# Patient Record
Sex: Female | Born: 2000 | Race: White | Hispanic: No | Marital: Single
Health system: Southern US, Community
[De-identification: ages and names within clinical notes are randomized; demographics above are authoritative.]

## PROBLEM LIST (undated history)

## (undated) DIAGNOSIS — R0600 Dyspnea, unspecified: Secondary | ICD-10-CM

## (undated) DIAGNOSIS — G90A Postural orthostatic tachycardia syndrome (POTS): Secondary | ICD-10-CM

## (undated) DIAGNOSIS — R079 Chest pain, unspecified: Secondary | ICD-10-CM

## (undated) DIAGNOSIS — R5383 Other fatigue: Secondary | ICD-10-CM

## (undated) DIAGNOSIS — J069 Acute upper respiratory infection, unspecified: Secondary | ICD-10-CM

## (undated) DIAGNOSIS — G4700 Insomnia, unspecified: Secondary | ICD-10-CM

## (undated) DIAGNOSIS — U071 COVID-19: Secondary | ICD-10-CM

## (undated) DIAGNOSIS — S060XAA Concussion with loss of consciousness status unknown, initial encounter: Secondary | ICD-10-CM

## (undated) DIAGNOSIS — G4710 Hypersomnia, unspecified: Secondary | ICD-10-CM

## (undated) HISTORY — DX: Postural orthostatic tachycardia syndrome (POTS): G90.A

## (undated) HISTORY — DX: Dyspnea, unspecified: R06.00

## (undated) HISTORY — DX: Insomnia, unspecified: G47.00

## (undated) HISTORY — PX: TYMPANOSTOMY TUBE PLACEMENT: SHX32

## (undated) HISTORY — DX: Acute upper respiratory infection, unspecified: J06.9

## (undated) HISTORY — PX: CHOLECYSTECTOMY: SHX55

## (undated) HISTORY — DX: Chest pain, unspecified: R07.9

## (undated) HISTORY — DX: COVID-19: U07.1

## (undated) HISTORY — DX: Hypersomnia, unspecified: G47.10

## (undated) HISTORY — DX: Other fatigue: R53.83

---
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
## 2022-04-21 ENCOUNTER — Emergency Department (HOSPITAL_COMMUNITY): Payer: Medicaid Other

## 2022-04-21 ENCOUNTER — Encounter (HOSPITAL_COMMUNITY): Payer: Self-pay

## 2022-04-21 ENCOUNTER — Emergency Department (HOSPITAL_COMMUNITY)
Admission: EM | Admit: 2022-04-21 | Discharge: 2022-04-21 | Disposition: A | Discharge status: Home / Self Care | Payer: Medicaid Other | Attending: Emergency Medicine | Admitting: Emergency Medicine

## 2022-04-21 ENCOUNTER — Other Ambulatory Visit: Payer: Self-pay

## 2022-04-21 DIAGNOSIS — R0602 Shortness of breath: Secondary | ICD-10-CM | POA: Insufficient documentation

## 2022-04-21 DIAGNOSIS — R0789 Other chest pain: Secondary | ICD-10-CM | POA: Diagnosis not present

## 2022-04-21 LAB — BASIC METABOLIC PANEL
Anion gap: 7 (ref 5–15)
BUN: 12 mg/dL (ref 6–20)
CO2: 25 mmol/L (ref 22–32)
Calcium: 9.1 mg/dL (ref 8.9–10.3)
Chloride: 109 mmol/L (ref 98–111)
Creatinine, Ser: 0.56 mg/dL (ref 0.44–1.00)
GFR, Estimated: >60 mL/min (ref >60)
Glucose, Bld: 92 mg/dL (ref 70–99)
Potassium: 4.3 mmol/L (ref 3.5–5.1)
Sodium: 141 mmol/L (ref 135–145)

## 2022-04-21 LAB — CBC
HCT: 40.6 % (ref 36.0–46.0)
Hemoglobin: 13.1 g/dL (ref 12.0–15.0)
MCH: 27.1 pg (ref 26.0–34.0)
MCHC: 32.3 g/dL (ref 30.0–36.0)
MCV: 83.9 fL (ref 80.0–100.0)
Platelets: 314 10*3/uL (ref 150–400)
RBC: 4.84 MIL/uL (ref 3.87–5.11)
RDW: 12.9 % (ref 11.5–15.5)
WBC: 7.1 10*3/uL (ref 4.0–10.5)
nRBC: 0 % (ref 0.0–0.2)

## 2022-04-21 LAB — TROPONIN I (HIGH SENSITIVITY)
Troponin I (High Sensitivity): <2 ng/L (ref <18)
Troponin I (High Sensitivity): <2 ng/L (ref <18)

## 2022-04-21 MED ORDER — ACETAMINOPHEN 325 MG PO TABS
650.0000 mg | ORAL_TABLET | Freq: Once | ORAL | Status: AC
  Administered 2022-04-21: 650 mg via ORAL
  Filled 2022-04-21: qty 2

## 2022-04-21 MED ORDER — KETOROLAC TROMETHAMINE 30 MG/ML IJ SOLN
30.0000 mg | Freq: Once | INTRAMUSCULAR | Status: AC
  Administered 2022-04-21: 30 mg via INTRAMUSCULAR
  Filled 2022-04-21: qty 1

## 2022-04-21 NOTE — ED Triage Notes (Signed)
Pt reports chest tightness, dizziness and nausea. This has been intermittent for about a year. She came into day because it feels worse.

## 2022-04-21 NOTE — Discharge Instructions (Signed)
You have been seen today for your complaint of chest pain. Your lab work was reassuring and showed no abnormalities. Your imaging was reassuring and showed no abnormalities. Your discharge medications include Alternate tylenol and ibuprofen for pain. You may alternate these every 4 hours. You may take up to 800 mg of ibuprofen at a time and up to 1000 mg of tylenol. Follow up with: Cardiology.  They should call you within 3 business days to schedule an appointment Please seek immediate medical care if you develop any of the following symptoms: You have nausea or vomiting. You feel sweaty or light-headed. You have a cough with mucus from your lungs (sputum) or you cough up blood. You develop shortness of breath. At this time there does not appear to be the presence of an emergent medical condition, however there is always the potential for conditions to change. Please read and follow the below instructions.  Do not take your medicine if  develop an itchy rash, swelling in your mouth or lips, or difficulty breathing; call 911 and seek immediate emergency medical attention if this occurs.  You may review your lab tests and imaging results in their entirety on your MyChart account.  Please discuss all results of fully with your primary care provider and other specialist at your follow-up visit.  Note: Portions of this text may have been transcribed using voice recognition software. Every effort was made to ensure accuracy; however, inadvertent computerized transcription errors may still be present.

## 2022-04-21 NOTE — ED Provider Triage Note (Signed)
Emergency Medicine Provider Triage Evaluation Note  Rebecca Fuller , a 22 y.o. female  was evaluated in triage.  Pt complains of right-sided chest pain.  Has had this intermittently over the past year.  It got worse this morning at approximately 9 AM and has persisted.  It is not exertional.  No cardiac history.  Has seen cardiology for this in the past.  Reports mild associated shortness of breath.  Denies cough, fevers, vomiting.  Review of Systems  Positive: As above Negative: As above  Physical Exam  BP 116/64 (BP Location: Left Arm)   Pulse 90   Temp 98.5 F (36.9 C) (Oral)   Resp 17   Ht 4' 9.5" (1.461 m)   Wt 40.8 kg   SpO2 100%   BMI 19.14 kg/m  Gen:   Awake, no distress   Resp:  Normal effort  MSK:   Moves extremities without difficulty  Other:    Medical Decision Making  Medically screening exam initiated at 7:08 PM.  Appropriate orders placed.  [address] [name] was informed that the remainder of the evaluation will be completed by another provider, this initial triage assessment does not replace that evaluation, and the importance of remaining in the ED until their evaluation is complete.  ACS rule out   Rebecca Fuller 04/21/22 1909

## 2022-04-21 NOTE — ED Provider Notes (Signed)
[place] EMERGENCY DEPARTMENT AT [facility] Provider Note   CSN: [id number] Arrival date & time: 04/21/22  1846     History  Chief Complaint  Patient presents with   Chest Pain    Rebecca Fuller is a 22 y.o. female.  With a history of anxiety who presents to the ED for evaluation of right-sided chest pain.  She has had these pains intermittently for the past year.  She saw cardiology at [facility] and was unable to find the cause of her pain.  She presents today because she woke up and the pain was worse than normal beginning at approximately 9 AM and has persisted.  It is localized to the right side of her chest without radiation.  It is not exertional.  Denies other cardiac history or family history of sudden cardiac death.  She reports mild intermittent shortness of breath that she notices mostly when she is performing strenuous activity.  She denies cough, fevers, vomiting, diaphoresis, abdominal pain, history of DVT or PE, unilateral leg swelling, recent travel, surgeries or cancer treatments.  She does have a Nexplanon implant but denies other hormonal birth control.   Chest Pain      Home Medications Prior to Admission medications   Medication Sig Start Date End Date Taking? Authorizing Provider  amitriptyline (ELAVIL) 25 MG tablet Take 1 tablet every night 03/06/19   Teressa Lower, MD  b complex vitamins tablet Take 1 tablet by mouth daily. Patient not taking: Reported on 11/16/2018 09/22/18   Teressa Lower, MD  cyclobenzaprine (FLEXERIL) 10 MG tablet Take 1 tablet (10 mg total) by mouth 2 (two) times daily as needed for muscle spasms. 05/18/21   Tacy Learn, PA-C  diclofenac (VOLTAREN) 50 MG EC tablet Take 1 tablet (50 mg total) by mouth 2 (two) times daily. 05/18/21   Tacy Learn, PA-C  ferrous sulfate 325 (65 FE) MG tablet Take by mouth. 08/24/17   [provider]  FLUoxetine (PROZAC) 20 MG capsule TK 1 C PO QD 12/13/17   [provider]   ketorolac (TORADOL) 10 MG tablet Take 10 mg by mouth every 6 (six) hours as needed.    [provider]  Magnesium Oxide 500 MG TABS Take 1 tablet (500 mg total) by mouth daily. 09/22/18   Teressa Lower, MD  norelgestromin-ethinyl estradiol Marilu Favre) 150-35 MCG/24HR transdermal patch Place onto the skin. 06/16/17   [provider]  ondansetron (ZOFRAN) 4 MG tablet Take 1 tablet (4 mg total) by mouth every 8 (eight) hours as needed for nausea or vomiting. 10/11/18   Teressa Lower, MD      Allergies    Patient has no known allergies.    Review of Systems   Review of Systems  Cardiovascular:  Positive for chest pain.  All other systems reviewed and are negative.   Physical Exam Updated Vital Signs BP 114/62 (BP Location: Left Arm)   Pulse 87   Temp 98.6 F (37 C) (Oral)   Resp 16   Ht 4' 9.5" (1.461 m)   Wt 40.8 kg   SpO2 100%   BMI 19.14 kg/m  Physical Exam Vitals and nursing note reviewed.  Constitutional:      General: She is not in acute distress.    Appearance: She is well-developed.  HENT:     Head: Normocephalic and atraumatic.  Eyes:     Conjunctiva/sclera: Conjunctivae normal.  Cardiovascular:     Rate and Rhythm: Normal rate and regular rhythm.  Heart sounds: No murmur heard. Pulmonary:     Effort: Pulmonary effort is normal. No respiratory distress.     Breath sounds: Normal breath sounds.  Abdominal:     Palpations: Abdomen is soft.     Tenderness: There is no abdominal tenderness.  Musculoskeletal:        General: No swelling.     Cervical back: Neck supple.     Right lower leg: No edema.     Left lower leg: No edema.  Skin:    General: Skin is warm and dry.     Capillary Refill: Capillary refill takes less than 2 seconds.  Neurological:     Mental Status: She is alert.  Psychiatric:        Mood and Affect: Mood normal.     ED Results / Procedures / Treatments   Labs (all labs ordered are listed, but only abnormal results  are displayed) Labs Reviewed  BASIC METABOLIC PANEL  CBC  TROPONIN I (HIGH SENSITIVITY)  TROPONIN I (HIGH SENSITIVITY)    EKG EKG Interpretation  Date/Time:  Wednesday April 21 2022 18:56:25 EDT Ventricular Rate:  97 PR Interval:  117 QRS Duration: 84 QT Interval:  346 QTC Calculation: 440 R Axis:   66 Text Interpretation: Sinus rhythm Borderline short PR interval RSR' in V1 or V2, right VCD or RVH No old tracing to compare Confirmed by Aletta Edouard [phone]) on 04/21/2022 10:20:18 PM  Radiology DG Chest 2 View  Result Date: 04/21/2022 CLINICAL DATA:  Chest pain and shortness of breath. EXAM: CHEST - 2 VIEW COMPARISON:  September 24, 2020 FINDINGS: The heart size and mediastinal contours are within normal limits. Both lungs are clear. Radiopaque surgical clips are seen within the right upper quadrant. The visualized skeletal structures are unremarkable. IMPRESSION: No active cardiopulmonary disease. Electronically Signed   By: Virgina Norfolk M.D.   On: 04/21/2022 19:29    Procedures Procedures    Medications Ordered in ED Medications  ketorolac (TORADOL) 30 MG/ML injection 30 mg (30 mg Intramuscular Given 04/21/22 2040)  acetaminophen (TYLENOL) tablet 650 mg (650 mg Oral Given 04/21/22 2303)    ED Course/ Medical Decision Making/ A&P Clinical Course as of 04/21/22 2345  Wed Apr 21, 2022  2124 Patient reports improvement in her symptoms after Toradol [AS]    Clinical Course User Index [AS] Claudie Fisherman Grafton Folk, PA-C                             Medical Decision Making Amount and/or Complexity of Data Reviewed Labs: ordered. Radiology: ordered.  Risk OTC drugs. Prescription drug management.  This patient presents to the ED for concern of chest pain, this involves an extensive number of treatment options, and is a complaint that carries with it a high risk of complications and morbidity.  The emergent differential diagnosis of chest pain includes: Acute coronary  syndrome, pericarditis, aortic dissection, pulmonary embolism, tension pneumothorax, and esophageal rupture.  I do not believe the patient has an emergent cause of chest pain, other urgent/non-acute considerations include, but are not limited to: chronic angina, aortic stenosis, cardiomyopathy, myocarditis, mitral valve prolapse, pulmonary hypertension, hypertrophic obstructive cardiomyopathy (HOCM), aortic insufficiency, right ventricular hypertrophy, pneumonia, pleuritis, bronchitis, pneumothorax, tumor, gastroesophageal reflux disease (GERD), esophageal spasm, Mallory-Weiss syndrome, peptic ulcer disease, biliary disease, pancreatitis, functional gastrointestinal pain, cervical or thoracic disk disease or arthritis, shoulder arthritis, costochondritis, subacromial bursitis, anxiety or panic attack, herpes zoster, breast disorders, chest wall  tumors, thoracic outlet syndrome, mediastinitis.   Co morbidities that complicate the patient evaluation   anxiety, chronic chest pain  My initial workup includes ACS rule out, pain control  Additional history obtained from: Nursing notes from this visit.  I ordered, reviewed and interpreted labs which include: CBC, BMP, troponin, delta troponin.  Labs within normal limits  I ordered imaging studies including x-ray I independently visualized and interpreted imaging which showed normal I agree with the radiologist interpretation  Afebrile, hemodynamically stable.  22 year old female presents ED for evaluation of right-sided chest pain.  This chest pain has been intermittent for the past year and has not a new sensation.  It is slightly worse than it normally is this morning.  Initial and delta troponin negative.  Chest x-ray with no abnormalities.  EKG without ischemic changes.  Low suspicion for ACS as the cause of her symptoms.  PE was on the differential was considered to be less likely due to lack of risk factors and hemodynamic stability.  Her exam is  reassuring.  She is anxious, but there are no other abnormalities.  Ambulatory referral to cardiology was placed for second opinion as she was now pleased with the care she received at [facility] cardiology.  Symptoms appear to be mostly related to costochondritis versus anxiety at this time.  She did report significant improvement in her pain after Toradol.  She was given return precautions.  Stable at discharge.  At this time there does not appear to be any evidence of an acute emergency medical condition and the patient appears stable for discharge with appropriate outpatient follow up. Diagnosis was discussed with patient who verbalizes understanding of care plan and is agreeable to discharge. I have discussed return precautions with patient who verbalizes understanding. Patient encouraged to follow-up with their PCP within 1 week. All questions answered.  Note: Portions of this report may have been transcribed using voice recognition software. Every effort was made to ensure accuracy; however, inadvertent computerized transcription errors may still be present.        Final Clinical Impression(s) / ED Diagnoses Final diagnoses:  Atypical chest pain    Rx / DC Orders ED Discharge Orders          Ordered    Ambulatory referral to Cardiology       Comments: If you have not heard from the Cardiology office within the next 72 hours please call [phone].   04/21/22 2325              Roylene Reason, PA-C 04/21/22 2345    Hayden Rasmussen, MD 04/22/22 1052

## 2022-04-22 NOTE — Progress Notes (Signed)
Cardiology Office Note:   Date:  04/26/2022  ID:  Rebecca Fuller, DOB [birth year], MRN [id number]  History of Present Illness:   Rebecca Fuller is a 22 y.o. female with a history of anxiety who was referred by Dr. Doyle Askew for further evaluation of chest pain.  Patient seen 04/05/22 by Dr. Doyle Askew. Notes reviewed. Was seen in [facility] ER on 04/21/22 for chest pain. Trop negative. CXR normal. ECG without ischemia. D-dimer normal. Given reassuring work-up, she is now referred to Cardiology for further evaluation.  Today, the patient states that she has been having episodes of tightness in her chest. She has been in the ER several times where work-up was reassuring. Also seen by Cardiology in the past with extensive work-up including coronary CTA which was normal with Ca score 0, TTE with normal BiV function and no signficant valve disease. Cardiac monitor reportedly normal but could not view. ETT with no ischemia. CMR normal. She has a friend who has POTs and she is concerned she may have it too. She reports frequent episodes of dizziness that are worse with standing, chest pain, and palpitations. Had trialed metop in the past but did not tolerate. Has started salt tabs and states these have seemed to help some.   Had CTA coronaries which was normal. Ca score 0.   HgB 13.1. TSH normal 1.01  ROS: As per HPI  Studies Reviewed:    EKG:  NSR, HR 86-personally reviewed  CTA coronaries 03/2022:  FINDINGS:  CALCIUM SCORE:  LM:  0  LAD: 0  Circ: 0  RCA: 0  Total:  0    CARDIAC ANATOMY:  Ventricles:  The ventricles are normal in shape with normal wall thickness without aneurysm or mass.  LV FUNCTION:  LVEF of 73% with normal wall motion.  Atria:  The left and right atria are grossly normal.  There is no left atrial appendage clot.  Great arteries:  The great arteries are in normal anatomic relationship without stenosis or coarctation.  Pericardium:  There is no evidence of pericardial thickening or a  significant pericardial effusion  Valves:  The cardiac valves are grossly normal.    CORONARY ARTERIES:    LEFT MAIN:   Course:  The left main arises from its normal anatomic location in the left sinus of valsalva.  It gives rise to the circumflex and left anterior descending artery.    Stenosis:  No plaque or stenosis   LAD:  Course: The LAD arises from the left main and travels in the intraventricular groove.  It gives rise to one visible  diagonal artery.  Stenosis:  No plaque or stenosis; motion artifact noted in mid vessel.  1st Diagonal:  Arises distally; no apparent plaque or stenosis. Not well visualized secondary to motion artifact.   CIRCUMFLEX:  Course:  The circumflex artery arises from the left main and travels through the atrioventricular groove.  It gives rise to 1 visible  obtuse marginal artery.  Stenosis: No plaque or stenosis; proximal-mid vessel is not well visualized secondary to motion artifact.   1st Obtuse Marginal Artery: No plaque or stenosis   RCA:  Course: The right coronary artery arises from its typical location in the right coronary sinus.  It courses through the atrioventricular groove.  Stenosis: No plaque or stenosis; significant motion artifact in mid vessel; PDA arises distally.   Dominance:  There is right dominance.   CMR 09/2020:  FINDINGS:   . Left ventricle: Normal size and wall thickness.  Marland Kitchen  Right ventricle: Normal size and wall thickness. No aneurysm or fibrofatty infiltration.  . Left atrium: Normal size. Normal without filling defect. Left atrial appendage normal.  . Right atrium: Normal size. No filling defect  . Pericardium: Normal pericardial thickness. No effusion or masses.  Verl Blalock motion/ejection fraction: Normal wall motion. Ejection fraction 55-60%  . Aortic valve: No gross abnormalities. No regurgitant or stenotic flow jets.  . Mitral valve: No gross abnormalities. No regurgitant or stenotic flow jets.  . Tricuspid valve: No  gross abnormalities. No regurgitant or stenotic flow jets.  . Pulmonic valve: No gross abnormalities. No regurgitant or stenotic flow jets.  . Myocardial edema: No myocardial edema.  . First-pass perfusion: Normal. No subendocardial defects to suggest ischemia or infarct.  . Delayed myocardial enhancement: None  . 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 Abdominal situs solitus. Atrial  situs solitus. D Ventricular Loop. S Normal position great vessels.   Veins  Normal systemic venous drainage. Three pulmonary veins seen returning  to the left atrium.   Atrium  Normal right atrial size. Normal left atrial size.   Atrioventricular Valves  Normal mitral valve. No mitral valve insufficiency. Normal mitral  valve doppler inflow pattern. Normal tricuspid valve. Trivial  tricuspid valve insufficiency. Right ventricular pressure estimate  based on tricuspid regurgitant jet equals 12 mmHg, plus right atrial  pressure.   Left Ventricle  Normal left ventricle structure. Normal left ventricular dimension.  Normal left ventricular systolic function. Left ventricular ejection  fraction by Modified Simpson s Method = 63 %. Septal E-E  = 7.1.   Right Ventricle  Normal right ventricle structure. Normal right ventricular dimension.  Normal right ventricular systolic function.   Ventricular Septum  No evidence of ventricular septal defect.  Normal septal curvature.   Aortic Valve  Normal trileaftlet aortic valve. No aortic valve insufficiency. No  aortic valve stenosis.   Pulmonary Valve  Normal pulmonic valve. No pulmonary valve stenosis. Trivial pulmonary  insufficiency  physiologic .   Conotruncus  Normal conotruncus.   Aorta  Left aortic arch with normal branching pattern. Normal aortic root  size. Unobstructed aortic arch. Normal pulsatile flow in descending  aorta.   Pulmonary Artery  Normal pulmonary artery branches. No right pulmonary artery stenosis.  No left pulmonary artery stenosis.   Ductus Arteriosus  No patent ductus arteriosus.   Fluid  No pericardial effusion.       Risk Assessment/Calculations:              Physical Exam:   VS:  BP 110/68 (BP Location: Left Arm, Cuff Size: Normal)   Pulse (!) 106   Ht 4' 9.5" (1.461 m)   Wt 96 lb (43.5 kg)   BMI 20.41 kg/m    Wt Readings from Last 3 Encounters:  04/26/22 96 lb (43.5 kg)  04/21/22 90 lb (40.8 kg)  05/18/21 115 lb 1.3 oz (52.2 kg) (26 %, Z= -0.66)*   * Growth percentiles are based on  CDC (Girls, 2-20 Years) data.     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: #Chest Pain: #Lightheadedness #Palpitations -Very extensive cardiac work-up including coronary CTA (normal), CMR (normal), TTE (normal) and cardiac monitor (reportedly without arrhythmias or significant ectopy -TSH also normal and patient is not currently anemic -Orthostatics today most consistent with POTs like picture with an increase in HR without change in BP -Did not tolerate BB in the past -Feeling better on salt tablets which we will continue -Discussed importance of hydration and  compression socks as well -Patient would really like to try midodrine to see if this helps as a family member responded well to this medication; will give low dose today to see if symptoms improve         Signed, Freada Bergeron, MD

## 2022-04-26 ENCOUNTER — Ambulatory Visit: Payer: Medicaid Other | Attending: Cardiology | Admitting: Cardiology

## 2022-04-26 ENCOUNTER — Encounter: Payer: Self-pay | Admitting: Cardiology

## 2022-04-26 VITALS — BP 110/68 | HR 106 | Ht <= 58 in | Wt 96.0 lb

## 2022-04-26 DIAGNOSIS — R42 Dizziness and giddiness: Secondary | ICD-10-CM

## 2022-04-26 DIAGNOSIS — R002 Palpitations: Secondary | ICD-10-CM

## 2022-04-26 DIAGNOSIS — G90A Postural orthostatic tachycardia syndrome (POTS): Secondary | ICD-10-CM | POA: Diagnosis not present

## 2022-04-26 DIAGNOSIS — R072 Precordial pain: Secondary | ICD-10-CM | POA: Diagnosis not present

## 2022-04-26 MED ORDER — MIDODRINE HCL 2.5 MG PO TABS: 2.5000 mg | ORAL_TABLET | Freq: Two times a day (BID) | ORAL | 3 refills | Status: DC

## 2022-04-26 NOTE — Patient Instructions (Addendum)
Medication Instructions:  Your physician has recommended you make the following change in your medication:   1) START midodrine (Proamatine) 2.5mg  twice daily with meals  *If you need a refill on your cardiac medications before your next appointment, please call your pharmacy*  Lab Work: None  Testing/Procedures: None  Follow-Up: At [facility], you and your health needs are our priority.  As part of our continuing mission to provide you with exceptional heart care, we have created designated Provider Care Teams.  These Care Teams include your primary Cardiologist (physician) and Advanced Practice Providers (APPs -  Physician Assistants and Nurse Practitioners) who all work together to provide you with the care you need, when you need it.  Your next appointment:   6 month(s)  Provider:   Nicholes Rough, PA-C, Melina Copa, PA-C, Ambrose Pancoast, NP, Christen Bame, NP, or Richardson Dopp, PA-C

## 2022-04-29 ENCOUNTER — Telehealth: Payer: Self-pay

## 2022-04-29 NOTE — Telephone Encounter (Signed)
Patient sent message via MyChart  Hey, so there isnt any other cure for this? i have been feeling so bad the past couple of days sweaty , dizzy but ive been doing gatorade , liquid iv, salt intake. i know my friends mom had surgery but im not sure if thags a risk i want to undergo. is there anything else you can think of?   Called patient she stated for the past couple of days she is experiencing, dizziness,weakness and fatigue. Today while she was at work, pt was advised to leave because she looked pale. Denies shortness of breath, chest pain, or swelling. Will forward to MD and nurse.

## 2022-04-30 ENCOUNTER — Telehealth: Payer: Self-pay | Admitting: Cardiology

## 2022-04-30 DIAGNOSIS — G90A Postural orthostatic tachycardia syndrome (POTS): Secondary | ICD-10-CM

## 2022-04-30 NOTE — Telephone Encounter (Signed)
Patient called to talk with Dr. Shari Prows or nurse in regards to POTS procedure patient and Dr. Shari Prows had spoke about.

## 2022-04-30 NOTE — Telephone Encounter (Signed)
Dr. Shari Prows spoke with the pt on the phone about this.   Per Dr. Shari Prows, we will refer the pt to Dr. Graciela Husbands in EP for POTS.  Pt aware of this plan and that EP Scheduler will call her soon to arrange this appt.  Pt agreed to plan.

## 2022-04-30 NOTE — Telephone Encounter (Signed)
See phone note from today for further details.  Dr. Shari Prows called the pt on the phone.

## 2022-04-30 NOTE — Telephone Encounter (Signed)
Patient is requesting a return call to discuss getting referral for a surgery she is wanting to have for POTS. She states she was hoping to speak with someone today in regards this. Please advise.

## 2022-05-03 ENCOUNTER — Telehealth: Payer: Self-pay | Admitting: Cardiology

## 2022-05-03 NOTE — Telephone Encounter (Signed)
Her referral appt with Dr. Klein is also scheduled for 10/1 at 3:45 pm.  

## 2022-05-03 NOTE — Telephone Encounter (Signed)
-----   Message from Gerome Sam sent at 05/03/2022  2:31 PM EDT ----- Regarding: RE: refer to outside Cardiologist per Dr. Shari Prows for POTS All Information faxed to [phone].  Thanks ----- Message ----- From: Loa Socks, LPN Sent: 7/0/6237  12:58 PM EDT To: Pricilla Holm; [facility] Subject: refer to outside Cardiologist per Dr. Lorna Dibble  Dr. Shari Prows referred this pt to an outside EP Cardiologist who treats POTS and dysautonomia.  His name is Dr. Selinda Orion He is in [place].  His office number is [phone].    Referral placed and the pt is aware.  She is aware that you will help coordinate this referral.  Can you please help with the pt getting this referral appt made?   Thanks [employer]

## 2022-05-03 NOTE — Telephone Encounter (Signed)
Her referral appt with Dr. Graciela Husbands is also scheduled for 10/1 at 3:45 pm.

## 2022-05-03 NOTE — Telephone Encounter (Signed)
Referral to EP Cardiologist who treats POTS/Dysautonomia placed in the system for the pt, and as external.    Referral to Dr. Charlyne Quale in [place] and office number [phone].   Will have [facility] Scheduling team help coordinate referral for the pt, but pt was instructed to call their office to inquire about scheduling, for we have no way of scheduling their offices patients.

## 2022-05-03 NOTE — Addendum Note (Signed)
Addended by: Loa Socks on: 05/03/2022 12:56 PM   Modules accepted: Orders

## 2022-05-03 NOTE — Telephone Encounter (Signed)
-----   Message from Deborah D Miller sent at 05/03/2022  2:31 PM EDT ----- Regarding: RE: refer to outside Cardiologist per Dr. Pemberton for POTS All Information faxed to [phone].  Thanks ----- Message ----- From: Boby Eyer M, LPN Sent: 05/03/2022  12:58 PM EDT To: Sharon B Ferguson; [facility] Subject: refer to outside Cardiologist per Dr. Pember#  Dr. Pemberton referred this pt to an outside EP Cardiologist who treats POTS and dysautonomia.  His name is Dr. Sameah Mobarek He is in [place].  His office number is [phone].    Referral placed and the pt is aware.  She is aware that you will help coordinate this referral.  Can you please help with the pt getting this referral appt made?   Thanks Eliyah Mcshea     

## 2022-05-03 NOTE — Telephone Encounter (Signed)
Pt would like a call back from RN, she would like to talk about another referral.

## 2022-05-03 NOTE — Telephone Encounter (Signed)
Referral to EP Cardiologist who treats POTS/Dysautonomia placed in the system for the pt, and as external.    Referral to Dr. Samheh Mobarek in [place] and office number [phone].   Will have [facility] Scheduling team help coordinate referral for the pt, but pt was instructed to call their office to inquire about scheduling, for we have no way of scheduling their offices patients.  

## 2022-05-05 ENCOUNTER — Telehealth: Payer: Self-pay | Admitting: *Deleted

## 2022-05-05 NOTE — Telephone Encounter (Signed)
Message Received: Today Janett Billow, LPN Deborah faxed referral to MD on 05-03-22

## 2022-05-31 ENCOUNTER — Ambulatory Visit: Payer: Medicaid Other | Attending: Internal Medicine | Admitting: Internal Medicine

## 2022-05-31 ENCOUNTER — Encounter: Payer: Self-pay | Admitting: Internal Medicine

## 2022-05-31 VITALS — BP 112/70 | HR 84 | Ht <= 58 in | Wt 102.0 lb

## 2022-05-31 DIAGNOSIS — G90A Postural orthostatic tachycardia syndrome (POTS): Secondary | ICD-10-CM | POA: Diagnosis not present

## 2022-05-31 NOTE — Patient Instructions (Signed)
Medication Instructions:  Your physician recommends that you continue on your current medications as directed. Please refer to the Current Medication list given to you today.  *If you need a refill on your cardiac medications before your next appointment, please call your pharmacy*   Lab Work: None ordered.  If you have labs (blood work) drawn today and your tests are completely normal, you will receive your results only by: MyChart Message (if you have MyChart) OR A paper copy in the mail If you have any lab test that is abnormal or we need to change your treatment, we will call you to review the results.   Testing/Procedures: None ordered.    Follow-Up: At [facility], you and your health needs are our priority.  As part of our continuing mission to provide you with exceptional heart care, we have created designated Provider Care Teams.  These Care Teams include your primary Cardiologist (physician) and Advanced Practice Providers (APPs -  Physician Assistants and Nurse Practitioners) who all work together to provide you with the care you need, when you need it.  We recommend signing up for the patient portal called "MyChart".  Sign up information is provided on this After Visit Summary.  MyChart is used to connect with patients for Virtual Visits (Telemedicine).  Patients are able to view lab/test results, encounter notes, upcoming appointments, etc.  Non-urgent messages can be sent to your provider as well.   To learn more about what you can do with MyChart, go to [email or web address].    Your next appointment:   4-6 week telephone visit with Dr Graciela Husbands - I will have his scheduler call you  Other Instructions Discussed salt substitutes with a  goal of 7-10 gms of Sodium or 12-15 gm of sodium Chloride daily.  Rehydration solutions include Liquid IV, NUUN, TriOral, Normralyte pedialyte advanced care and Banana Bags.  Salt tablets include plain salt tablets, SaltStick  Vitassium, thermatabs amongst others.   The higher sodium concentration per serving on this list is normalyte about 800 mg of sodium per serving LMNT1000 mg and most recently of worried about Within You hydration formula also at 1000 mg  Salt supplements are best used with adjunctive sugar  Also discussed the role of compression wear including thigh sleeves and abdominal binders.  Calf compression is not specifically recommended..  Discuss Robinul with your PCP

## 2022-05-31 NOTE — Progress Notes (Signed)
ELECTROPHYSIOLOGY CONSULT NOTE  Patient ID: Rebecca Fuller, MRN: [id number], DOB/AGE: 13-Feb-2000 22 y.o. Admit date: (Not on file) Date of Consult: 05/31/2022  Primary Physician: Patrcia Dolly, DO Primary Cardiologist: [facility] Rebecca Fuller is a 22 y.o. female who is being seen today for the evaluation of POTS at the request of [facility].    HPI Rebecca Fuller is a 22 y.o. female with a lifelong history of dizziness and recurrent intermittent syncope.  The latter has been characterized with a stereotypical prodrome of tinnitus, nausea, diaphoresis.  Interestingly, she has no residual orthostatic intolerance.  She has significant heat intolerance shower intolerance menses intolerance and post exercise intolerance.  Syncopal episodes have occurred with giving blood, defecating, prolonged heat exposure complicated by orthostatic stress  ADLs are associated with dyspnea and tachypalpitations.  She saw Dr. Georgena Spurling, and a presumptive diagnosis of POTS.  Tried her on ProAmatine which was unassociated with benefit and she is stopped it on her own.  Sodium intake is about 2 to 3 g a day.  Volume intake is replete by history, interestingly out of the eights this urine specific gravity is in the chart only 1 is below 1.020; does not use alcohol or recreational drugs  Has a mental health history notable for OCD and anxiety for which she takes Luvox, not particularly helpful. \ She tells me that her mother died the day before Mother's Day 6 years ago.  She is living with her grandmother and grandfather since then they are very close.  She and her mom are very very close.       Date Cr K Hgb  5/24   0.64 4.2 is 13.7           Past Medical History:  Diagnosis Date   Chest pain    Concussion    COVID    Dyspnea    Fatigue    Hypersomnia    Insomnia    POTS (postural orthostatic tachycardia syndrome)    URI (upper respiratory infection)       Surgical History:  Past Surgical  History:  Procedure Laterality Date   CHOLECYSTECTOMY     TYMPANOSTOMY TUBE PLACEMENT       Home Meds: Current Meds  Medication Sig   azithromycin (ZITHROMAX) 250 MG tablet Take by mouth daily.   fluvoxaMINE (LUVOX) 100 MG tablet Take 150 mg by mouth in the morning and at bedtime. Take 100mg  pm, 50mg  am   meclizine (ANTIVERT) 25 MG tablet Take 25 mg by mouth 3 (three) times daily as needed for dizziness.   midodrine (PROAMATINE) 2.5 MG tablet Take 1 tablet (2.5 mg total) by mouth 2 (two) times daily with a meal.   ondansetron (ZOFRAN) 4 MG tablet Take 1 tablet (4 mg total) by mouth every 8 (eight) hours as needed for nausea or vomiting.   SUMAtriptan (IMITREX) 100 MG tablet Take 100 mg by mouth as needed for migraine.   zolpidem (AMBIEN) 5 MG tablet Take by mouth at bedtime as needed for sleep.   [DISCONTINUED] ondansetron (ZOFRAN-ODT) 4 MG disintegrating tablet Take by mouth as needed for nausea or vomiting.    Allergies: No Known Allergies  Social History   Socioeconomic History   Marital status: Single    Spouse name: Not on file   Number of children: Not on file   Years of education: Not on file   Highest education level: Not on file  Occupational History  Not on file  Tobacco Use   Smoking status: Unknown   Smokeless tobacco: Not on file  Substance and Sexual Activity   Alcohol use: Never   Drug use: Never   Sexual activity: Not on file  Other Topics Concern   Not on file  Social History Narrative   Lives with mom, dad and brother. She is in the 11th grade at [employer]   Social Determinants of Health   Financial Resource Strain: Not on file  Food Insecurity: Not on file  Transportation Needs: Not on file  Physical Activity: Not on file  Stress: Not on file  Social Connections: Not on file  Intimate Partner Violence: Not on file     Family History  Problem Relation Age of Onset   Migraines Neg Hx    Seizures Neg Hx    Autism Neg Hx    ADD / ADHD Neg Hx     Anxiety disorder Neg Hx    Depression Neg Hx    Bipolar disorder Neg Hx    Schizophrenia Neg Hx      ROS:  Please see the history of present illness.     All other systems reviewed and negative.    Physical Exam: Blood pressure 112/70, pulse 84, height 4' 9.5" (1.461 m), weight 102 lb (46.3 kg), SpO2 97 %. General: Well developed, well nourished female in no acute distress. Head: Normocephalic, atraumatic, sclera non-icteric, no xanthomas, nares are without discharge. EENT: normal  Lymph Nodes:  none Neck: Negative for carotid bruits. JVD not elevated. Back:without scoliosis kyphosis Lungs: Clear bilaterally to auscultation without wheezes, rales, or rhonchi. Breathing is unlabored. Heart: RRR with S1 S2. No murmur . No rubs, or gallops appreciated. Abdomen: Soft, non-tender, non-distended with normoactive bowel sounds. No hepatomegaly. No rebound/guarding. No obvious abdominal masses. Msk:  Strength and tone appear normal for age. Extremities: No clubbing or cyanosis. No  edema.  Distal pedal pulses are 2+ and equal bilaterally. Skin: Warm and moist Neuro: Alert and oriented X 3. CN III-XII intact Grossly normal sensory and motor function . Psych:  Responds to questions appropriately with a normal affect.        EKG: Sinus at 84 01/02/1934 RSR prime   Assessment and Plan:  Dysautonomia with postural tachycardia and vasovagal syncope  OCD/anxiety   Patient has symptoms consistent with both orthostatic tachycardia as well as vasovagal syncope with with various triggers. She also has significant OCD and anxiety symptoms which she is being treated currently with Luvox and as needed Ambien.  She stopped the ProAmatine on her own because it was unhelpful.  I concur.  We discussed extensively the issues of dysautonomia, the physiology of orthstasis and positional stress.  We discussed the role of salt and water repletion, the importance of exercise, often needing to be  started in the recumbent position, and the awareness of triggers and the role of ambient heat and dehydration  Discussed salt substitutes with a  goal of 7-10 gms of Sodium or 12-15 gm of sodium Chloride daily.  Rehydration solutions include Liquid IV, NUUN, TriOral, Normralyte pedialyte advanced care and Banana Bags.  Salt tablets include plain salt tablets, SaltStick Vitassium, thermatabs amongst others.   The higher sodium concentration per serving on this list is normalyte about 800 mg of sodium per serving LMNT1000 mg and most recently of worried about Within You hydration formula also at 1000 mg  Salt supplements are best used with adjunctive sugar  Also discussed the role  of compression wear including thigh sleeves and abdominal binders.  Calf compression is not specifically recommended..  Suggested she talk with her PCP about Robinul for her sweating which can be a big nuisance  Suggested she talk to her gynecologist about alternative oral contraceptive therapy given the irregular and very heavy bleeding that is ongoing  I suggested that she be very intentional about caring for the burns which are hers not least of which was the death of her mother on the day before Mother's Day.    Sherryl Manges

## 2022-06-10 ENCOUNTER — Encounter (HOSPITAL_COMMUNITY): Payer: Self-pay

## 2022-06-10 ENCOUNTER — Emergency Department (HOSPITAL_COMMUNITY): Payer: Medicaid Other

## 2022-06-10 ENCOUNTER — Emergency Department (HOSPITAL_COMMUNITY)
Admission: EM | Admit: 2022-06-10 | Discharge: 2022-06-10 | Disposition: A | Discharge status: Home / Self Care | Payer: Medicaid Other | Attending: Emergency Medicine | Admitting: Emergency Medicine

## 2022-06-10 DIAGNOSIS — R0789 Other chest pain: Secondary | ICD-10-CM | POA: Diagnosis present

## 2022-06-10 DIAGNOSIS — Z8616 Personal history of COVID-19: Secondary | ICD-10-CM | POA: Diagnosis not present

## 2022-06-10 DIAGNOSIS — R55 Syncope and collapse: Secondary | ICD-10-CM | POA: Diagnosis not present

## 2022-06-10 LAB — URINALYSIS, ROUTINE W REFLEX MICROSCOPIC
Bacteria, UA: NONE SEEN
Bilirubin Urine: NEGATIVE
Glucose, UA: NEGATIVE mg/dL
Ketones, ur: NEGATIVE mg/dL
Leukocytes,Ua: NEGATIVE
Nitrite: NEGATIVE
Protein, ur: NEGATIVE mg/dL
Specific Gravity, Urine: 1.018 (ref 1.005–1.030)
pH: 5 (ref 5.0–8.0)

## 2022-06-10 LAB — I-STAT BETA HCG BLOOD, ED (MC, WL, AP ONLY): I-stat hCG, quantitative: <5 m[IU]/mL (ref <5)

## 2022-06-10 LAB — BASIC METABOLIC PANEL
Anion gap: 8 (ref 5–15)
BUN: 13 mg/dL (ref 6–20)
CO2: 24 mmol/L (ref 22–32)
Calcium: 9.6 mg/dL (ref 8.9–10.3)
Chloride: 106 mmol/L (ref 98–111)
Creatinine, Ser: 0.63 mg/dL (ref 0.44–1.00)
GFR, Estimated: >60 mL/min (ref >60)
Glucose, Bld: 86 mg/dL (ref 70–99)
Potassium: 3.7 mmol/L (ref 3.5–5.1)
Sodium: 138 mmol/L (ref 135–145)

## 2022-06-10 LAB — HEPATIC FUNCTION PANEL
ALT: 38 U/L (ref 0–44)
AST: 18 U/L (ref 15–41)
Albumin: 3.9 g/dL (ref 3.5–5.0)
Alkaline Phosphatase: 74 U/L (ref 38–126)
Bilirubin, Direct: <0.1 mg/dL (ref 0.0–0.2)
Total Bilirubin: 0.8 mg/dL (ref 0.3–1.2)
Total Protein: 7 g/dL (ref 6.5–8.1)

## 2022-06-10 LAB — CBC
HCT: 39.9 % (ref 36.0–46.0)
Hemoglobin: 13.1 g/dL (ref 12.0–15.0)
MCH: 27.4 pg (ref 26.0–34.0)
MCHC: 32.8 g/dL (ref 30.0–36.0)
MCV: 83.5 fL (ref 80.0–100.0)
Platelets: 328 10*3/uL (ref 150–400)
RBC: 4.78 MIL/uL (ref 3.87–5.11)
RDW: 12.6 % (ref 11.5–15.5)
WBC: 4.1 10*3/uL (ref 4.0–10.5)
nRBC: 0 % (ref 0.0–0.2)

## 2022-06-10 LAB — PREGNANCY, URINE: Preg Test, Ur: NEGATIVE

## 2022-06-10 LAB — TROPONIN I (HIGH SENSITIVITY): Troponin I (High Sensitivity): <2 ng/L (ref <18)

## 2022-06-10 LAB — D-DIMER, QUANTITATIVE: D-Dimer, Quant: <0.27 ug/mL-FEU (ref 0.00–0.50)

## 2022-06-10 LAB — LIPASE, BLOOD: Lipase: 28 U/L (ref 11–51)

## 2022-06-10 MED ORDER — PANTOPRAZOLE SODIUM 40 MG PO TBEC: 40.0000 mg | DELAYED_RELEASE_TABLET | Freq: Every day | ORAL | 0 refills | Status: DC

## 2022-06-10 MED ORDER — SODIUM CHLORIDE 0.9 % IV BOLUS
1000.0000 mL | Freq: Once | INTRAVENOUS | Status: AC
  Administered 2022-06-10: 1000 mL via INTRAVENOUS

## 2022-06-10 MED ORDER — KETOROLAC TROMETHAMINE 30 MG/ML IJ SOLN
30.0000 mg | Freq: Once | INTRAMUSCULAR | Status: AC
  Administered 2022-06-10: 30 mg via INTRAVENOUS
  Filled 2022-06-10: qty 1

## 2022-06-10 MED ORDER — METOPROLOL TARTRATE 25 MG PO TABS: 50.0000 mg | ORAL_TABLET | Freq: Two times a day (BID) | ORAL | 0 refills | Status: DC | PRN

## 2022-06-10 NOTE — Discharge Instructions (Signed)
You were seen in the emergency room today with chest discomfort and feeling like you are going to pass out.  Continue following with the cardiology doctor as well as your primary care doctor.  I am prescribing metoprolol to take if your heart rate increases suddenly and you are feeling symptoms.  For now, take this only as needed until you are seen by the cardiologist. Return with an new or worsening symptoms.

## 2022-06-10 NOTE — ED Triage Notes (Signed)
Pt presents with c/o chest pain that started several days ago and a near syncopal episode that occurred last night. Pt reports she does have these episodes frequently as she reports having POTS. Pt reports the chest pain is under her right breast.

## 2022-06-10 NOTE — ED Provider Notes (Signed)
Emergency Department Provider Note   I have reviewed the triage vital signs and the nursing notes.   HISTORY  Chief Complaint Chest Pain and Near Syncope   HPI Rebecca Fuller is a 22 y.o. female with PMH of POTS presents to the emergency department for evaluation of near syncope with right sided CP. Pain began several days prior without clear provoking factors. No clear pleuritic CP. Some SOB. Near syncope symptoms noted and similar to POTS symptoms. No prior VTE history. No vomiting.   Past Medical History:  Diagnosis Date   Chest pain    Concussion    COVID    Dyspnea    Fatigue    Hypersomnia    Insomnia    POTS (postural orthostatic tachycardia syndrome)    URI (upper respiratory infection)     Review of Systems  Constitutional: No fever/chills Cardiovascular: Positive chest pain. Positive palpitations and near syncope.  Respiratory: Denies shortness of breath. Gastrointestinal: No abdominal pain.  No nausea, no vomiting.  No diarrhea.  Musculoskeletal: Negative for back pain. Skin: Negative for rash. Neurological: Negative for headaches.  ____________________________________________   PHYSICAL EXAM:  VITAL SIGNS: ED Triage Vitals [06/10/22 1244]  Enc Vitals Group     BP 121/77     Pulse Rate 93     Resp 18     Temp 98.9 F (37.2 C)     Temp Source Oral     SpO2 97 %   Constitutional: Alert and oriented. Well appearing and in no acute distress. Eyes: Conjunctivae are normal. Head: Atraumatic. Nose: No congestion/rhinnorhea. Mouth/Throat: Mucous membranes are moist.  Neck: No stridor.   Cardiovascular: Normal rate, regular rhythm. Good peripheral circulation. Grossly normal heart sounds.   Respiratory: Normal respiratory effort.  No retractions. Lungs CTAB. Gastrointestinal: Soft and nontender. Negative Murphy's sign. No distention.  Musculoskeletal: No gross deformities of extremities. Neurologic:  Normal speech and language. Skin:  Skin is  warm, dry and intact. No rash noted.  ____________________________________________   LABS (all labs ordered are listed, but only abnormal results are displayed)  Labs Reviewed  URINALYSIS, ROUTINE W REFLEX MICROSCOPIC - Abnormal; Notable for the following components:      Result Value   Hgb urine dipstick SMALL (*)    All other components within normal limits  BASIC METABOLIC PANEL  CBC  LIPASE, BLOOD  HEPATIC FUNCTION PANEL  PREGNANCY, URINE  D-DIMER, QUANTITATIVE  I-STAT BETA HCG BLOOD, ED ([facility], [facility], [facility] ONLY)  TROPONIN I (HIGH SENSITIVITY)   ____________________________________________  EKG   EKG Interpretation  Date/Time:  Thursday Jun 10 2022 12:48:36 EDT Ventricular Rate:  89 PR Interval:  116 QRS Duration: 87 QT Interval:  356 QTC Calculation: 434 R Axis:   83 Text Interpretation: Sinus rhythm Borderline short PR interval RSR' in V1 or V2, right VCD or RVH Confirmed by Alona Bene [phone]) on 06/10/2022 1:20:53 PM        ____________________________________________  RADIOLOGY  DG Chest 2 View  Result Date: 06/10/2022 CLINICAL DATA:  Chest pain. Started several days ago. Near syncopal episode last night. EXAM: CHEST - 2 VIEW COMPARISON:  Chest radiographs 04/21/2022 FINDINGS: Cardiac silhouette and mediastinal contours are within normal limits. The lungs are clear. No pleural effusion or pneumothorax. No acute skeletal abnormality. Cholecystectomy clips. IMPRESSION: No active cardiopulmonary disease. Electronically Signed   By: Neita Garnet M.D.   On: 06/10/2022 12:57    ____________________________________________   PROCEDURES  Procedure(s) performed:   Procedures   ____________________________________________  INITIAL IMPRESSION / ASSESSMENT AND PLAN / ED COURSE  Pertinent labs & imaging results that were available during my care of the patient were reviewed by me and considered in my medical decision making (see chart for details).   This  patient is Presenting for Evaluation of CP, which does require a range of treatment options, and is a complaint that involves a high risk of morbidity and mortality.  The Differential Diagnoses includes but is not exclusive to acute coronary syndrome, aortic dissection, pulmonary embolism, cardiac tamponade, community-acquired pneumonia, pericarditis, musculoskeletal chest wall pain, etc.   Critical Interventions-    Medications  sodium chloride 0.9 % bolus 1,000 mL (0 mLs Intravenous Stopped 06/10/22 1518)  ketorolac (TORADOL) 30 MG/ML injection 30 mg (30 mg Intravenous Given 06/10/22 1340)    Reassessment after intervention: Vitals remain WNL.   I decided to review pertinent External Data, and in summary patient also followed in the [facility] system. Has been seen for CAP/CP/UTI in the past. Abx earlier this month with concern for CAP. D dimer negative at that time. No CTA PE study. Has had a CT coronary in March of this year. Calcium score of 0.    Clinical Laboratory Tests Ordered, included pregnancy negative. No AKI. D dimer negative. Troponin negative.   Radiologic Tests Ordered, included CXR. I independently interpreted the images and agree with radiology interpretation.   Cardiac Monitor Tracing which shows NSR.    Social Determinants of Health Risk denies smoking history.   Medical Decision Making: Summary:  Presents to the emergency department for evaluation of chest pain and near syncope.  Symptoms seem similar to some in the past although reported as different by the patient here.  Plan for workup including D-dimer and troponin.  Considered intra-abdominal etiologies for pain evaluate on LFTs and bilirubin although negative Murphy sign on exam.   Reevaluation with update and discussion with patient. She is feeling well after IVF. Has Cardiology follow up scheduled. No ectopy on telemetry. Plan for discharge.   Considered admission but ED workup reassuring.   Patient's  presentation is most consistent with acute presentation with potential threat to life or bodily function.   Disposition: discharge  ____________________________________________  FINAL CLINICAL IMPRESSION(S) / ED DIAGNOSES  Final diagnoses:  Atypical chest pain  Near syncope     NEW OUTPATIENT MEDICATIONS STARTED DURING THIS VISIT:  Discharge Medication List as of 06/10/2022  3:16 PM     START taking these medications   Details  metoprolol tartrate (LOPRESSOR) 25 MG tablet Take 2 tablets (50 mg total) by mouth 2 (two) times daily as needed (heart palpitations)., Starting Thu 06/10/2022, Normal    pantoprazole (PROTONIX) 40 MG tablet Take 1 tablet (40 mg total) by mouth daily., Starting Thu 06/10/2022, Until Sat 07/10/2022, Normal        Note:  This document was prepared using Dragon voice recognition software and may include unintentional dictation errors.  Alona Bene, MD, [facility]    Gamal Todisco, Arlyss Repress, MD 06/19/22 [phone]

## 2022-07-08 ENCOUNTER — Telehealth: Payer: Self-pay | Admitting: Internal Medicine

## 2022-07-08 NOTE — Telephone Encounter (Signed)
Patient wants to schedule a follow-up tele-visit with Dr. Graciela Husbands.

## 2022-07-14 ENCOUNTER — Encounter: Payer: Self-pay | Admitting: Internal Medicine

## 2022-07-14 ENCOUNTER — Encounter: Payer: Self-pay | Admitting: Cardiology

## 2022-07-14 DIAGNOSIS — R002 Palpitations: Secondary | ICD-10-CM

## 2022-07-14 DIAGNOSIS — G90A Postural orthostatic tachycardia syndrome (POTS): Secondary | ICD-10-CM

## 2022-07-14 DIAGNOSIS — R42 Dizziness and giddiness: Secondary | ICD-10-CM

## 2022-07-14 DIAGNOSIS — R072 Precordial pain: Secondary | ICD-10-CM

## 2022-07-19 NOTE — Telephone Encounter (Signed)
Janett Billow, LPN Referral faxed to [email or web address]  [phone]/office [phone]

## 2022-07-19 NOTE — Addendum Note (Signed)
Addended by: Loa Socks on: 07/19/2022 01:46 PM   Modules accepted: Orders

## 2022-08-01 ENCOUNTER — Encounter: Payer: Self-pay | Admitting: Internal Medicine

## 2022-08-02 ENCOUNTER — Telehealth: Payer: Self-pay | Admitting: *Deleted

## 2022-08-02 ENCOUNTER — Ambulatory Visit: Payer: Medicaid Other | Attending: Cardiology | Admitting: Internal Medicine

## 2022-08-02 ENCOUNTER — Encounter: Payer: Self-pay | Admitting: Internal Medicine

## 2022-08-02 ENCOUNTER — Telehealth: Payer: Self-pay

## 2022-08-02 VITALS — HR 105 | Ht <= 58 in

## 2022-08-02 DIAGNOSIS — G90A Postural orthostatic tachycardia syndrome (POTS): Secondary | ICD-10-CM

## 2022-08-02 MED ORDER — METOPROLOL TARTRATE 25 MG PO TABS: 50.0000 mg | ORAL_TABLET | Freq: Two times a day (BID) | ORAL | 0 refills | Status: DC | PRN

## 2022-08-02 NOTE — Telephone Encounter (Signed)
referral Received: 3 days ago Janett Billow, LPN Referral was faxed to 07-19-22 to [facility] .   Receive notic on 07-21-25 that patient was denied due to [employer] :  We do not accept Self Pay

## 2022-08-02 NOTE — Progress Notes (Signed)
Electrophysiology TeleHealth Note      Date:  08/02/2022   ID:  Rebecca Fuller, DOB 12-04-2000, MRN [id number]  Location: patient's home  Provider location: [address], [place]  Evaluation Performed: Follow-up visit  PCP:  Patrcia Dolly, DO  Cardiologist:     Electrophysiologist:  SK   Chief Complaint:  POTS  History of Present Illness:    Rebecca Fuller is a 22 y.o. female who presents via audio conferencing for a telehealth visit today.  Since last being seen in our clinic for dysautonomia manifested by both POTS as well as vasovagal syncope in the context of OCD and anxiety the patient reports doing some better in some ways nocturnal otherwise.  Still sweating--  HR improved with metoprolol   Recurrent syncope with BM   Showers--associate with presyncope  On July 4, had episode of tachycardia nausea pre syncope chest pressure with palpitations  >> assoc vomiting ended up in emergency room with IV hydration with improved symptoms   The patient denies symptoms of fevers, chills, cough, or new SOB worrisome for COVID 19.    Past Medical History:  Diagnosis Date   Chest pain    Concussion    COVID    Dyspnea    Fatigue    Hypersomnia    Insomnia    POTS (postural orthostatic tachycardia syndrome)    URI (upper respiratory infection)     Past Surgical History:  Procedure Laterality Date   CHOLECYSTECTOMY     TYMPANOSTOMY TUBE PLACEMENT      Current Outpatient Medications  Medication Sig Dispense Refill   azithromycin (ZITHROMAX) 250 MG tablet Take by mouth daily.     meclizine (ANTIVERT) 25 MG tablet Take 25 mg by mouth 3 (three) times daily as needed for dizziness.     metoprolol tartrate (LOPRESSOR) 25 MG tablet Take 2 tablets (50 mg total) by mouth 2 (two) times daily as needed (heart palpitations). 30 tablet 0   ondansetron (ZOFRAN) 4 MG tablet Take 1 tablet (4 mg total) by mouth every 8 (eight) hours as needed for nausea or  vomiting. 20 tablet 1   SUMAtriptan (IMITREX) 100 MG tablet Take 100 mg by mouth as needed for migraine.     zolpidem (AMBIEN) 5 MG tablet Take by mouth at bedtime as needed for sleep.     pantoprazole (PROTONIX) 40 MG tablet Take 1 tablet (40 mg total) by mouth daily. 30 tablet 0   No current facility-administered medications for this visit.    Allergies:   Patient has no known allergies.   ROS:  Please see the history of present illness.   All other systems are personally reviewed and negative.    Exam:    Vital Signs:  Pulse (!) 105   Ht 4' 9.5" (1.461 m)   LMP 07/31/2022   BMI 21.69 kg/m       Labs/Other Tests and Data Reviewed:    Recent Labs: 06/10/2022: ALT 38; BUN 13; Creatinine, Ser 0.63; Hemoglobin 13.1; Platelets 328; Potassium 3.7; Sodium 138   Wt Readings from Last 3 Encounters:  05/31/22 102 lb (46.3 kg)  04/26/22 96 lb (43.5 kg)  04/21/22 90 lb (40.8 kg)     Other studies personally reviewed: Additional studies/ records that were reviewed today include:   Review of the above records today demonstrates:  Prior radiographs:    ASSESSMENT & PLAN:    Dysautonomia with postural tachycardia and vasovagal syncope   OCD/anxiety  Following defecation ---- Showers use shower chair Continue metoprolol   Continue compression  Begin robinul 1 mg tid   Follow-up:  12 weeks    Current medicines are reviewed at length with the patient today.   The patient  concerns regarding her medicines.  The following changes were made today:  resume metoprolol and begin robinul (As above)   Labs/ tests ordered today include:  No orders of the defined types were placed in this encounter.     Today, I have spent  21 minutes with the patient with telehealth technology discussing the above.  Signed, Sherryl Manges, MD  08/02/2022 2:00 PM     [facility] [address] [place] [address] [phone] (office) [phone] (fax)

## 2022-08-02 NOTE — Telephone Encounter (Signed)
..   Pt understands that although there may be some limitations with this type of visit, we will take all precautions to reduce any security or privacy concerns.  Pt understands that this will be treated like an in office visit and we will file with pt's insurance, and there may be a patient responsible charge related to this service. ? ?

## 2022-08-03 ENCOUNTER — Encounter: Payer: Self-pay | Admitting: Internal Medicine

## 2022-08-03 NOTE — Addendum Note (Signed)
Addended by: Loa Socks on: 08/03/2022 06:26 PM   Modules accepted: Orders

## 2022-08-03 NOTE — Telephone Encounter (Signed)
Referral sent again back to Dr. Erenest Rasher at [facility] for the pt to establish care with them for POTS.  Will send this back to our [facility] Scheduling team so that they can work on getting this referral to Dr. Read Drivers at [facility] and get the pt scheduled to see them.  Pt is aware of this via mychart.  She is aware that the referral can take a week or so before this crosses over into their system to review.  Advised the pt to follow-up with their office in about 2-3 weeks about the referral and to get her scheduled, if she hasn't heard from them before that time.   Information below with Dr. Read Drivers office:  Forrestine Him MD [address], [place], [place] [address] [phone]  this was the fax number

## 2022-08-04 MED ORDER — GLYCOPYRROLATE 1 MG PO TABS: 1.0000 mg | ORAL_TABLET | Freq: Three times a day (TID) | ORAL | 6 refills | Status: AC

## 2022-08-04 NOTE — Telephone Encounter (Signed)
Per 7/8 telephone visit. Begin robinul 1 mg tid    Prescription sent to pharmacy

## 2022-08-05 NOTE — Telephone Encounter (Signed)
RE: REFER TO DR. Erenest Rasher WITH [facility] FOR POTS PER DR. Shari Prows Received: Maxwell Marion, Bunnie Pion, LPN Anmol Paschen, All Information faxed [phone].

## 2022-08-10 NOTE — Telephone Encounter (Signed)
This medication was refilled on 08/02/2022.

## 2022-08-18 ENCOUNTER — Telehealth: Payer: Self-pay | Admitting: *Deleted

## 2022-08-18 NOTE — Telephone Encounter (Signed)
-----   Message from NA Etta Grandchild sent at 08/12/2022  4:24 PM EDT ----- Regarding: RE: REFER TO DR. Erenest Rasher WITH [facility] FOR POTS PER DR. Shari Prows Faxed on 08/04/22. ----- Message ----- From: Loa Socks, LPN Sent: 01/30/1094   6:29 PM EDT To: Pricilla Holm; Cecil Cranker Price; # Subject: REFER TO DR. Erenest Rasher WITH [facility]#  This pt will need to be referred again to see Dr. Erenest Rasher with [facility] for POTS and to establish.  She was originally referred there but pt wanted to go to [facility] instead.  Referral to [facility] was closed and [facility] was sent.  [facility] does not take her insurance and [facility] does.  She now wants to be referred back to [facility] again.  I placed a new referral to Dr. Read Drivers with [facility] again.  Information below is their office contact and fax.  Can you please work on getting this referral back to them and get the pt scheduled?  Pt is aware you will help with this and that referral can take a couple weeks before Dr. Read Drivers office can see this.  Thanks and sorry for the back and forth with this.  Thanks [employer]    Information below with Dr. Read Drivers office:  Forrestine Him MD [address], [place], [place] [address] [phone]  this was the fax number

## 2022-08-27 ENCOUNTER — Other Ambulatory Visit: Payer: Self-pay | Admitting: Internal Medicine

## 2022-10-07 ENCOUNTER — Telehealth: Payer: Medicaid Other | Admitting: Internal Medicine

## 2022-10-25 ENCOUNTER — Ambulatory Visit: Payer: BC Managed Care – PPO | Attending: Physician Assistant | Admitting: Physician Assistant

## 2022-10-25 NOTE — Progress Notes (Deleted)
  Cardiology Office Note:  .   Date:  10/25/2022  ID:  Rebecca Fuller, DOB [birth year], MRN [id number] PCP: Patrcia Dolly, DO  [facility] Providers Cardiologist:  Meriam Sprague, MD Electrophysiologist:  Sherryl Manges, MD {  History of Present Illness: Rebecca Fuller   Rebecca Fuller is a 22 y.o. female with a lifelong history of dizziness and recurrent intermittent syncope.  The latter has been characterized with a stereotypical prodrome of tinnitus, nausea, diaphoresis.  Interestingly, she has no residual orthostatic intolerance.  She has significant heat intolerance, shower intolerance, months this intolerance, and post exercise intolerance.  She was only intaking 2 to 3 g of salt and had some mental health issues notable for OCD and anxiety.  ROS: ***  Studies Reviewed: .        *** Risk Assessment/Calculations:   {Does this patient have ATRIAL FIBRILLATION?:[phone]} No BP recorded.  {Refresh Note OR Click here to enter BP  :1}***       Physical Exam:   VS:  There were no vitals taken for this visit.   Wt Readings from Last 3 Encounters:  05/31/22 102 lb (46.3 kg)  04/26/22 96 lb (43.5 kg)  04/21/22 90 lb (40.8 kg)    GEN: Well nourished, well developed in no acute distress NECK: No JVD; No carotid bruits CARDIAC: ***RRR, no murmurs, rubs, gallops RESPIRATORY:  Clear to auscultation without rales, wheezing or rhonchi  ABDOMEN: Soft, non-tender, non-distended EXTREMITIES:  No edema; No deformity   ASSESSMENT AND PLAN: .   ***    {Are you ordering a CV Procedure (e.g. stress test, cath, DCCV, TEE, etc)?   Press F2        :[id number]}  Dispo: ***  Signed, Sharlene Dory, PA-C

## 2022-10-26 ENCOUNTER — Encounter: Payer: Self-pay | Admitting: Physician Assistant

## 2022-10-26 ENCOUNTER — Institutional Professional Consult (permissible substitution): Payer: Medicaid Other | Admitting: Internal Medicine

## 2022-11-15 ENCOUNTER — Emergency Department (HOSPITAL_COMMUNITY): Payer: BC Managed Care – PPO

## 2022-11-15 ENCOUNTER — Encounter (HOSPITAL_COMMUNITY): Payer: Self-pay

## 2022-11-15 ENCOUNTER — Emergency Department (HOSPITAL_COMMUNITY)
Admission: EM | Admit: 2022-11-15 | Discharge: 2022-11-16 | Discharge status: Against Medical Advice | Payer: BC Managed Care – PPO | Attending: Student | Admitting: Student

## 2022-11-15 ENCOUNTER — Other Ambulatory Visit: Payer: Self-pay

## 2022-11-15 DIAGNOSIS — R200 Anesthesia of skin: Secondary | ICD-10-CM | POA: Insufficient documentation

## 2022-11-15 DIAGNOSIS — R202 Paresthesia of skin: Secondary | ICD-10-CM | POA: Diagnosis not present

## 2022-11-15 DIAGNOSIS — R5383 Other fatigue: Secondary | ICD-10-CM | POA: Insufficient documentation

## 2022-11-15 DIAGNOSIS — R079 Chest pain, unspecified: Secondary | ICD-10-CM | POA: Insufficient documentation

## 2022-11-15 DIAGNOSIS — Z5321 Procedure and treatment not carried out due to patient leaving prior to being seen by health care provider: Secondary | ICD-10-CM | POA: Insufficient documentation

## 2022-11-15 LAB — CBC
HCT: 40.5 % (ref 36.0–46.0)
Hemoglobin: 12.9 g/dL (ref 12.0–15.0)
MCH: 26.8 pg (ref 26.0–34.0)
MCHC: 31.9 g/dL (ref 30.0–36.0)
MCV: 84.2 fL (ref 80.0–100.0)
Platelets: 387 10*3/uL (ref 150–400)
RBC: 4.81 MIL/uL (ref 3.87–5.11)
RDW: 13.2 % (ref 11.5–15.5)
WBC: 7.2 10*3/uL (ref 4.0–10.5)
nRBC: 0 % (ref 0.0–0.2)

## 2022-11-15 LAB — COMPREHENSIVE METABOLIC PANEL
ALT: 39 U/L (ref 0–44)
AST: 26 U/L (ref 15–41)
Albumin: 4.9 g/dL (ref 3.5–5.0)
Alkaline Phosphatase: 86 U/L (ref 38–126)
Anion gap: 9 (ref 5–15)
BUN: 14 mg/dL (ref 6–20)
CO2: 25 mmol/L (ref 22–32)
Calcium: 9.6 mg/dL (ref 8.9–10.3)
Chloride: 102 mmol/L (ref 98–111)
Creatinine, Ser: 0.52 mg/dL (ref 0.44–1.00)
GFR, Estimated: >60 mL/min (ref >60)
Glucose, Bld: 93 mg/dL (ref 70–99)
Potassium: 3.7 mmol/L (ref 3.5–5.1)
Sodium: 136 mmol/L (ref 135–145)
Total Bilirubin: 0.8 mg/dL (ref 0.3–1.2)
Total Protein: 7.6 g/dL (ref 6.5–8.1)

## 2022-11-15 LAB — TROPONIN I (HIGH SENSITIVITY): Troponin I (High Sensitivity): <2 ng/L (ref <18)

## 2022-11-15 LAB — HCG, QUANTITATIVE, PREGNANCY: hCG, Beta Chain, Quant, S: <1 m[IU]/mL (ref <5)

## 2022-11-15 LAB — VITAMIN B12: Vitamin B-12: 296 pg/mL (ref 180–914)

## 2022-11-15 LAB — MAGNESIUM: Magnesium: 1.9 mg/dL (ref 1.7–2.4)

## 2022-11-15 MED ORDER — ASPIRIN 81 MG PO CHEW: 324.0000 mg | CHEWABLE_TABLET | Freq: Once | ORAL | Status: DC

## 2022-11-15 NOTE — ED Triage Notes (Addendum)
Pt arrived reporting Hx of POTS. Feeling tired,chest pain, numbness all over x2 days. Denies any other symptoms. No recent travels

## 2022-11-15 NOTE — ED Notes (Signed)
I called patient to recheck vitals and no one responded 

## 2022-11-15 NOTE — ED Provider Triage Note (Signed)
Emergency Medicine Provider Triage Evaluation Note  Rebecca Fuller , a 22 y.o. female  was evaluated in triage.  Pt complains of generalized fatigue, tingling, chest pain.  Review of Systems  Positive:  Negative:  Physical Exam  BP 135/82 (BP Location: Left Arm)   Pulse (!) 101   Temp 98.3 F (36.8 C) (Oral)   Resp (!) 24   Ht 5\' 1"  (1.549 m)   Wt 46.3 kg   SpO2 100%   BMI 19.29 kg/m  Gen:   Awake, no distress   Resp:  Normal effort  MSK:   Moves extremities without difficulty  Other:    Medical Decision Making  Medically screening exam initiated at 6:04 PM.  Appropriate orders placed.  [address] Rebecca Fuller was informed that the remainder of the evaluation will be completed by another provider, this initial triage assessment does not replace that evaluation, and the importance of remaining in the ED until their evaluation is complete.  Hx POTS. Generalized fatigue, chest pain, nausea, generalized tingling in extremities. Started last night. Denies cough, vomiting, diarrhea, dysuria, hematuria, hematochezia.    Rebecca Fuller, [place] 11/15/22 [phone]

## 2023-02-07 ENCOUNTER — Encounter: Payer: Self-pay | Admitting: Internal Medicine

## 2023-02-26 ENCOUNTER — Encounter: Payer: Self-pay | Admitting: Internal Medicine

## 2023-03-02 ENCOUNTER — Ambulatory Visit: Payer: BC Managed Care – PPO | Admitting: Internal Medicine

## 2023-03-02 DIAGNOSIS — G90A Postural orthostatic tachycardia syndrome (POTS): Secondary | ICD-10-CM

## 2023-04-25 DIAGNOSIS — G901 Familial dysautonomia [Riley-Day]: Secondary | ICD-10-CM | POA: Insufficient documentation

## 2023-04-29 ENCOUNTER — Ambulatory Visit: Payer: BC Managed Care – PPO | Attending: Internal Medicine | Admitting: Internal Medicine

## 2023-04-29 ENCOUNTER — Encounter: Payer: Self-pay | Admitting: Internal Medicine

## 2023-04-29 VITALS — BP 108/64 | HR 76 | Ht <= 58 in | Wt 98.4 lb

## 2023-04-29 DIAGNOSIS — G901 Familial dysautonomia [Riley-Day]: Secondary | ICD-10-CM

## 2023-04-29 NOTE — Patient Instructions (Addendum)
 Medication Instructions:  Your physician recommends that you continue on your current medications as directed. Please refer to the Current Medication list given to you today.  *If you need a refill on your cardiac medications before your next appointment, please call your pharmacy*  Lab Work: None ordered.  If you have labs (blood work) drawn today and your tests are completely normal, you will receive your results only by: MyChart Message (if you have MyChart) OR A paper copy in the mail If you have any lab test that is abnormal or we need to change your treatment, we will call you to review the results.  Testing/Procedures: None ordered.   Follow-Up: At [facility] you and your health needs are our priority.  As part of our continuing mission to provide you with exceptional heart care, our providers are all part of one team.  This team includes your primary Cardiologist (physician) and Advanced Practice Providers or APPs (Physician Assistants and Nurse Practitioners) who all work together to provide you with the care you need, when you need it.  Your next appointment:   12 week telephone visit  with Dr Graciela Husbands      1st Floor: - Lobby - Registration  - Pharmacy  - Lab - Cafe  2nd Floor: - PV Lab - Diagnostic Testing (echo, CT, nuclear med)  3rd Floor: - Vacant  4th Floor: - TCTS (cardiothoracic surgery) - AFib Clinic - Structural Heart Clinic - Vascular Surgery  - Vascular Ultrasound  5th Floor: - HeartCare Cardiology (general and EP) - Clinical Pharmacy for coumadin, hypertension, lipid, weight-loss medications, and med management appointments    Valet parking services will be available as well.

## 2023-04-29 NOTE — Progress Notes (Signed)
 Patient Care Team: Patrcia Dolly, DO as PCP - General (Family Medicine) Meriam Sprague, MD (Inactive) as PCP - Cardiology (Cardiology) [facility] Salvia, MD as PCP - Electrophysiology (Cardiology)   HPI  Rebecca Fuller is a 23 y.o. female seen in followup for recurrent neurally mediated syncope and hat/shower/orthostatic intolerance in conjuction with anxiety and emotional loss ( death of her mom)  Interval diagnosis of H. pylori.  Significant abdominal discomfort.  Now on antibiotics.  Very poor p.o. intake.  Rx with BB helped with palps Recurrent syncope and mostly related to BMs.  Is on stool softener but still struggles with straining.   Date Cr K Hgb  5/24   0.64 4.2  13.7   10/24 0.52 3.7 12.9    Records and Results Reviewed   Past Medical History:  Diagnosis Date   Chest pain    Concussion    COVID    Dyspnea    Fatigue    Hypersomnia    Insomnia    POTS (postural orthostatic tachycardia syndrome)    URI (upper respiratory infection)     Past Surgical History:  Procedure Laterality Date   CHOLECYSTECTOMY     TYMPANOSTOMY TUBE PLACEMENT      Current Meds  Medication Sig   glycopyrrolate (ROBINUL) 1 MG tablet Take 1 tablet (1 mg total) by mouth 3 (three) times daily.   meclizine (ANTIVERT) 25 MG tablet Take 25 mg by mouth 3 (three) times daily as needed for dizziness.   metoprolol tartrate (LOPRESSOR) 25 MG tablet TAKE 2 TABLETS (50 MG TOTAL) BY MOUTH 2 (TWO) TIMES DAILY AS NEEDED (HEART PALPITATIONS).   ondansetron (ZOFRAN) 4 MG tablet Take 1 tablet (4 mg total) by mouth every 8 (eight) hours as needed for nausea or vomiting.   VOQUEZNA TRIPLE PAK 500-500-20 MG THPK Take by mouth. Take 4 capsules by mouth 2 (two) times daily for 14 days.   zolpidem (AMBIEN) 5 MG tablet Take by mouth at bedtime as needed for sleep.    No Known Allergies    Review of Systems negative except from HPI and PMH  Physical Exam BP 108/64   Pulse 76    Ht 4\' 10"  (1.473 m)   Wt 98 lb 6.4 oz (44.6 kg)   LMP 04/08/2023 (Approximate)   SpO2 99%   BMI 20.57 kg/m  Well developed and well nourished in no acute distress HENT normal E scleral and icterus clear Neck Supple JVP flat; carotids brisk and full Clear to ausculation Regular rate and rhythm, no murmurs gallops or rub Soft with active bowel sounds No clubbing cyanosis  Edema Alert and oriented, grossly normal motor and sensory function Skin Warm and Dry  ECG sinus 76 01/01/37  CrCl cannot be calculated (Patient's most recent lab result is older than the maximum 21 days allowed.).   Assessment and  Plan Dysautonomia with postural tachycardia and vasovagal syncope   OCD/anxiety  H Pylori  Dysautonomia symptoms and interval syncope have been worse of late in the context of ongoing GI distress abdominal pain and a interval diagnosis of H. pylori.  All of these folks need to be kept in mind for arcuate median ligament syndrome  Not taking fluids well at all.  "Encouraged her to try to sip instead of chug.        Current medicines are reviewed at length with the patient today .  The patient does not  have concerns regarding medicines.

## 2023-05-15 ENCOUNTER — Encounter: Payer: Self-pay | Admitting: Internal Medicine

## 2023-05-16 ENCOUNTER — Telehealth: Payer: Self-pay | Admitting: Internal Medicine

## 2023-05-16 DIAGNOSIS — R002 Palpitations: Secondary | ICD-10-CM

## 2023-05-16 NOTE — Telephone Encounter (Signed)
 Patient c/o Palpitations:  STAT if patient reporting lightheadedness, shortness of breath, or chest pain  How long have you had palpitations/irregular HR/ Afib? Are you having the symptoms now? Palpitations (she states heavy beats)   Are you currently experiencing lightheadedness, SOB or CP? CP currently, she states its been going on a couple days   Do you have a history of afib (atrial fibrillation) or irregular heart rhythm? No   Have you checked your BP or HR? (document readings if available): she states she its has been between 140- 190. She states she doesn't have her apple watch right now so she cannot record right now   Are you experiencing any other symptoms? No

## 2023-05-16 NOTE — Telephone Encounter (Signed)
 Spoke with pt and advised per Dr Rodolfo Clan 3 day Zio monitor ordered for increased POTS symptoms.  Will send monitor instructions through MyChart.  Pt verbalizes understanding and thanked Charity fundraiser for the call.

## 2023-05-16 NOTE — Telephone Encounter (Signed)
 Patient states she has been having a bad pots flare up and would like to know can she get a monitor ordered. She states she has not been feeling well the last couple of days and it has been worrying her.

## 2023-05-17 ENCOUNTER — Ambulatory Visit: Attending: Internal Medicine

## 2023-05-17 DIAGNOSIS — R002 Palpitations: Secondary | ICD-10-CM

## 2023-05-17 NOTE — Progress Notes (Unsigned)
 Enrolled for [employer] to mail a ZIO XT long term holter monitor to the patients address on file.

## 2023-06-06 ENCOUNTER — Encounter: Payer: Self-pay | Admitting: Internal Medicine

## 2023-06-20 DIAGNOSIS — R002 Palpitations: Secondary | ICD-10-CM

## 2023-08-11 ENCOUNTER — Emergency Department (HOSPITAL_BASED_OUTPATIENT_CLINIC_OR_DEPARTMENT_OTHER)
Admission: EM | Admit: 2023-08-11 | Discharge: 2023-08-11 | Disposition: A | Discharge status: Home / Self Care | Attending: Emergency Medicine | Admitting: Emergency Medicine

## 2023-08-11 ENCOUNTER — Emergency Department (HOSPITAL_BASED_OUTPATIENT_CLINIC_OR_DEPARTMENT_OTHER)

## 2023-08-11 ENCOUNTER — Encounter (HOSPITAL_BASED_OUTPATIENT_CLINIC_OR_DEPARTMENT_OTHER): Payer: Self-pay

## 2023-08-11 ENCOUNTER — Other Ambulatory Visit: Payer: Self-pay

## 2023-08-11 DIAGNOSIS — R1084 Generalized abdominal pain: Secondary | ICD-10-CM | POA: Diagnosis not present

## 2023-08-11 DIAGNOSIS — R109 Unspecified abdominal pain: Secondary | ICD-10-CM | POA: Diagnosis present

## 2023-08-11 DIAGNOSIS — R112 Nausea with vomiting, unspecified: Secondary | ICD-10-CM | POA: Diagnosis not present

## 2023-08-11 DIAGNOSIS — Z8616 Personal history of COVID-19: Secondary | ICD-10-CM | POA: Diagnosis not present

## 2023-08-11 LAB — COMPREHENSIVE METABOLIC PANEL WITH GFR
ALT: 18 U/L (ref 0–44)
AST: 19 U/L (ref 15–41)
Albumin: 4.6 g/dL (ref 3.5–5.0)
Alkaline Phosphatase: 92 U/L (ref 38–126)
Anion gap: 11 (ref 5–15)
BUN: 12 mg/dL (ref 6–20)
CO2: 22 mmol/L (ref 22–32)
Calcium: 9.8 mg/dL (ref 8.9–10.3)
Chloride: 106 mmol/L (ref 98–111)
Creatinine, Ser: 0.46 mg/dL (ref 0.44–1.00)
GFR, Estimated: >60 mL/min (ref >60)
Glucose, Bld: 89 mg/dL (ref 70–99)
Potassium: 4.2 mmol/L (ref 3.5–5.1)
Sodium: 140 mmol/L (ref 135–145)
Total Bilirubin: 0.4 mg/dL (ref 0.0–1.2)
Total Protein: 6.8 g/dL (ref 6.5–8.1)

## 2023-08-11 LAB — URINALYSIS, ROUTINE W REFLEX MICROSCOPIC
Bilirubin Urine: NEGATIVE
Glucose, UA: NEGATIVE mg/dL
Ketones, ur: NEGATIVE mg/dL
Leukocytes,Ua: NEGATIVE
Nitrite: NEGATIVE
Protein, ur: NEGATIVE mg/dL
Specific Gravity, Urine: 1.02 (ref 1.005–1.030)
pH: 6 (ref 5.0–8.0)

## 2023-08-11 LAB — PREGNANCY, URINE: Preg Test, Ur: NEGATIVE

## 2023-08-11 LAB — CBC
HCT: 38.6 % (ref 36.0–46.0)
Hemoglobin: 12.3 g/dL (ref 12.0–15.0)
MCH: 25.9 pg — ABNORMAL LOW (ref 26.0–34.0)
MCHC: 31.9 g/dL (ref 30.0–36.0)
MCV: 81.4 fL (ref 80.0–100.0)
Platelets: 332 K/uL (ref 150–400)
RBC: 4.74 MIL/uL (ref 3.87–5.11)
RDW: 14 % (ref 11.5–15.5)
WBC: 5.5 K/uL (ref 4.0–10.5)
nRBC: 0 % (ref 0.0–0.2)

## 2023-08-11 LAB — URINALYSIS, MICROSCOPIC (REFLEX)

## 2023-08-11 LAB — LIPASE, BLOOD: Lipase: 17 U/L (ref 11–51)

## 2023-08-11 MED ORDER — MORPHINE SULFATE (PF) 4 MG/ML IV SOLN
4.0000 mg | Freq: Once | INTRAVENOUS | Status: AC
  Administered 2023-08-11: 4 mg via INTRAVENOUS
  Filled 2023-08-11: qty 1

## 2023-08-11 MED ORDER — SODIUM CHLORIDE 0.9 % IV BOLUS
1000.0000 mL | Freq: Once | INTRAVENOUS | Status: AC
  Administered 2023-08-11: 1000 mL via INTRAVENOUS

## 2023-08-11 MED ORDER — METOCLOPRAMIDE HCL 5 MG/ML IJ SOLN
10.0000 mg | Freq: Once | INTRAMUSCULAR | Status: AC
  Administered 2023-08-11: 10 mg via INTRAVENOUS
  Filled 2023-08-11: qty 2

## 2023-08-11 MED ORDER — PANTOPRAZOLE SODIUM 40 MG IV SOLR
40.0000 mg | Freq: Once | INTRAVENOUS | Status: AC
  Administered 2023-08-11: 40 mg via INTRAVENOUS
  Filled 2023-08-11: qty 10

## 2023-08-11 NOTE — Discharge Instructions (Signed)

## 2023-08-11 NOTE — ED Provider Notes (Signed)
 Emergency Department Provider Note   I have reviewed the triage vital signs and the nursing notes.   HISTORY  Chief Complaint Abdominal Pain and Flank Pain   HPI Rebecca Fuller is a 23 y.o. female with past history of POTS and abdominal pain/vomiting with gastritis presents to the emergency department with continued symptoms.  She has had an ongoing issue with abdominal pain and vomiting.  She had been constipated but took a laxative and that has since resolved.  Patient has had recent abdominal imaging including CT abdomen pelvis on 6/23, pelvic ultrasound on 6/27 and abdominal MRI on 7/11.  She been taking Zofran  at home but unable to keep this down and continues having vomiting. Denies EtOH or drug use. No THC use.    Past Medical History:  Diagnosis Date   Chest pain    Concussion    COVID    Dyspnea    Fatigue    Hypersomnia    Insomnia    POTS (postural orthostatic tachycardia syndrome)    URI (upper respiratory infection)     Review of Systems  Constitutional: No fever/chills Cardiovascular: Denies chest pain. Respiratory: Denies shortness of breath. Gastrointestinal: Positive abdominal pain. Positive nausea and vomiting.  No diarrhea.   Skin: Negative for rash. Neurological: Negative for headaches.  ____________________________________________   PHYSICAL EXAM:  VITAL SIGNS: ED Triage Vitals [08/11/23 0832]  Encounter Vitals Group     BP 112/79     Pulse Rate 87     Resp 18     Temp 98.2 F (36.8 C)     Temp src      SpO2 100 %   Constitutional: Alert and oriented. Well appearing and in no acute distress. Eyes: Conjunctivae are normal.  Head: Atraumatic. Nose: No congestion/rhinnorhea. Mouth/Throat: Mucous membranes are moist.  Neck: No stridor.   Cardiovascular: Normal rate, regular rhythm. Good peripheral circulation. Grossly normal heart sounds.   Respiratory: Normal respiratory effort.  No retractions. Lungs CTAB. Gastrointestinal: Soft  and nontender. No distention.  Musculoskeletal: No lower extremity tenderness nor edema. No gross deformities of extremities. Neurologic:  Normal speech and language. No gross focal neurologic deficits are appreciated.  Skin:  Skin is warm, dry and intact. No rash noted.  ____________________________________________   LABS (all labs ordered are listed, but only abnormal results are displayed)  Labs Reviewed  CBC - Abnormal; Notable for the following components:      Result Value   MCH 25.9 (*)    All other components within normal limits  URINALYSIS, ROUTINE W REFLEX MICROSCOPIC - Abnormal; Notable for the following components:   Hgb urine dipstick MODERATE (*)    All other components within normal limits  URINALYSIS, MICROSCOPIC (REFLEX) - Abnormal; Notable for the following components:   Bacteria, UA RARE (*)    All other components within normal limits  LIPASE, BLOOD  COMPREHENSIVE METABOLIC PANEL WITH GFR  PREGNANCY, URINE   ____________________________________________  EKG   EKG Interpretation Date/Time:  Thursday August 11 2023 08:52:35 EDT Ventricular Rate:  80 PR Interval:  105 QRS Duration:  93 QT Interval:  377 QTC Calculation: 435 R Axis:   74  Text Interpretation: Sinus rhythm Short PR interval Low voltage, precordial leads RSR' in V1 or V2, right VCD or RVH Confirmed by Darra Chew [phone]) on 08/11/2023 9:01:41 AM        ____________________________________________  RADIOLOGY  US  PELVIC COMPLETE W TRANSVAGINAL AND TORSION R/O Result Date: 08/11/2023 CLINICAL DATA:  Pelvic pain  right ovarian cyst EXAM: TRANSABDOMINAL ULTRASOUND OF PELVIS TECHNIQUE: Transabdominal ultrasound examination of the pelvis was performed including evaluation of the uterus, ovaries, adnexal regions, and pelvic cul-de-sac. COMPARISON:  CT abdomen pelvis June 23 25 FINDINGS: Uterus Measurements: 6.7 x 2.9 x 4.3 cm = volume: 44 mL. No fibroids or other mass visualized. Endometrium  Thickness: 6.1 mm.  No focal abnormality visualized. Right ovary Measurements: 3.9 x 2.5 x 3.1 cm = volume: 15.7 mL. Right ovary simple appearing cyst measuring 3.2 x 2.2 x 2.7 cm. No solid component or increased vascularity. Left ovary Measurements: 2.3 x 1.6 x 1.6 cm = volume: 3 mL. Normal appearance/no adnexal mass. Other findings:  No abnormal free fluid. IMPRESSION: Right ovary simple cyst measuring 3.2 cm, stable to prior CT. Electronically Signed   By: Megan  Zare M.D.   On: 08/11/2023 11:32    ____________________________________________   PROCEDURES  Procedure(s) performed:   Procedures  None  ____________________________________________   INITIAL IMPRESSION / ASSESSMENT AND PLAN / ED COURSE  Pertinent labs & imaging results that were available during my care of the patient were reviewed by me and considered in my medical decision making (see chart for details).   This patient is Presenting for Evaluation of abdominal pain, which does require a range of treatment options, and is a complaint that involves a high risk of morbidity and mortality.  The Differential Diagnoses includes but is not exclusive to ectopic pregnancy, ovarian cyst, ovarian torsion, acute appendicitis, urinary tract infection, endometriosis, bowel obstruction, hernia, colitis, renal colic, gastroenteritis, volvulus etc.   Critical Interventions-    Medications  sodium chloride  0.9 % bolus 1,000 mL (0 mLs Intravenous Stopped 08/11/23 1037)  metoCLOPramide  (REGLAN ) injection 10 mg (10 mg Intravenous Given 08/11/23 0902)  pantoprazole  (PROTONIX ) injection 40 mg (40 mg Intravenous Given 08/11/23 0902)  morphine  (PF) 4 MG/ML injection 4 mg (4 mg Intravenous Given 08/11/23 0958)    Reassessment after intervention: symptoms improved.    I did obtain Additional Historical Information from grandmother at bedside.   I decided to review pertinent External Data, and in summary CT, US  and MRI results reviewed.    Clinical Laboratory Tests Ordered, included pregnancy negative.  No UTI.  CBC without leukocytosis.  No AKI on CMP.  Normal LFTs, bilirubin, lipase.  Radiologic Tests Ordered, included pelvic US . I independently interpreted the images and agree with radiology interpretation.   Cardiac Monitor Tracing which shows NSR.    Social Determinants of Health Risk patient is a non-smoker.   Medical Decision Making: Summary:  Patient presents to the ED with continued abdominal pain. Patient has been extensively evaluated for this in the past. Has GI follow up tomorrow.   Reevaluation with update and discussion with patient.  Pelvic ultrasound reassuring.  No torsion.  Labs reassuring.  She is feeling better after pain/nausea management in the ED along with IV fluids.  She has a GI follow-up appointment tomorrow.  Work note provided.  Considered admission but symptoms worsened.   Patient's presentation is most consistent with acute presentation with potential threat to life or bodily function.   Disposition: discharge  ____________________________________________  FINAL CLINICAL IMPRESSION(S) / ED DIAGNOSES  Final diagnoses:  Generalized abdominal pain  Nausea and vomiting, unspecified vomiting type    Note:  This document was prepared using Dragon voice recognition software and may include unintentional dictation errors.  Fonda Law, MD, [facility] Emergency [facility]    Jacorey Donaway, Fonda MATSU, MD 08/11/23 1515

## 2023-08-11 NOTE — ED Notes (Signed)

## 2023-08-11 NOTE — ED Triage Notes (Addendum)
 Patient comes in with abdominal pain, bloating, and nausea and vomiting. She was seen recently for kidney stone and a cysts in the kidney and ovary. She reports constipation and took a laxative last night and had a small bowel movement and reports passing some gas.

## 2023-08-12 ENCOUNTER — Ambulatory Visit: Admitting: Internal Medicine

## 2023-09-23 ENCOUNTER — Ambulatory Visit: Attending: Cardiology | Admitting: Cardiology

## 2023-09-23 ENCOUNTER — Encounter: Payer: Self-pay | Admitting: Cardiology

## 2023-09-23 VITALS — BP 116/70 | HR 70 | Ht <= 58 in | Wt 103.4 lb

## 2023-09-23 DIAGNOSIS — R5383 Other fatigue: Secondary | ICD-10-CM | POA: Insufficient documentation

## 2023-09-23 DIAGNOSIS — G901 Familial dysautonomia [Riley-Day]: Secondary | ICD-10-CM | POA: Insufficient documentation

## 2023-09-23 DIAGNOSIS — I479 Paroxysmal tachycardia, unspecified: Secondary | ICD-10-CM | POA: Diagnosis present

## 2023-09-23 DIAGNOSIS — R5382 Chronic fatigue, unspecified: Secondary | ICD-10-CM | POA: Diagnosis present

## 2023-09-23 DIAGNOSIS — R55 Syncope and collapse: Secondary | ICD-10-CM | POA: Insufficient documentation

## 2023-09-23 MED ORDER — METOPROLOL SUCCINATE ER 25 MG PO TB24: ORAL_TABLET | ORAL | 3 refills | Status: AC

## 2023-09-23 NOTE — Assessment & Plan Note (Signed)
 Persistent fatigue, possibly linked to beta-blocker use. Switching to Toprol  from Lopressor . - We will start off by reducing reducing evening metoprolol  dose (take 25 mg Toprol -XL) if fatigue continues. - Monitor fatigue post-switch to long-acting metoprolol .

## 2023-09-23 NOTE — Assessment & Plan Note (Signed)
 No recent syncope, occasional lightheadedness persists, improved with hydration. - Continue hydration strategies. - Use shower chair to prevent falls during lightheadedness. - Continue stool softeners Dr. Adjustments to avoid constipation-has not had any BM related syncope.

## 2023-09-23 NOTE — Progress Notes (Signed)
 Cardiology Office Note:  .   Date:  09/23/2023  ID:  Rebecca Fuller, DOB Jun 23, 2000, MRN [id number] PCP: Billy Toribio Kerns, DO  [facility] Providers Cardiologist:  Powell FORBES Sorrow, MD (Inactive) Electrophysiologist:  Elspeth Sage, MD     Chief Complaint  Patient presents with   Follow-up    No recurrent syncope, but has had near syncope.  More fatigue    Patient Profile: .     Rebecca Fuller is a  23 y.o. female  with a PMH notable for dysautonomia with orthostatic intolerance and history of syncope who presents here for 15-month follow-up at the request of Billy Toribio Kerns, DO.  She has a history of dysautonomia-vasovagal syncope as well as mostly syncope with BM and presyncope in shower.  She also has paroxysmal tachycardia.     Rebecca Fuller was last seen by Dr. Sage on April 4 for follow-up noting the beta-blocker is helping her palpitations.  Occasionally having presyncopal episodes with showers.  She was having recurrent syncope associated BM.  Stool softener.  Encouraged her to sip fluids instead of chugging.  Also continues to recommend shower stool  Subjective  Discussed the use of AI scribe software for clinical note transcription with the patient, who gave verbal consent to proceed.  History of Present Illness Rebecca Fuller is a 23 year old female with tachycardia who presents with increased episodes of rapid heart rate.  She experiences increased episodes of rapid heart rate, occurring both at rest and during work. These episodes last from an hour to an entire day and occur randomly about two to three times a week. Her heart rate reaches up to 120 beats per minute, even after taking metoprolol , 50 mg twice daily, at 6 AM and 1 PM. She notices her heart rate increasing around 10 AM, before her second dose. No irregular heartbeats, but she feels tired and sometimes lightheaded, especially during showers.  She has a history of syncope, particularly  during bowel movements. She consumes two Element drinks and four to five glasses of water daily. Occasionally, she feels close to passing out and manages this by sitting down and applying ice to her neck.  She experiences excessive sweating during sleep, managed with Grobanil for about a year. She reports a small appetite, eating only twice a day, and feels very tired, capable of sleeping all day if undisturbed.   Cardiovascular ROS: positive for - irregular heartbeat, palpitations, rapid heart rate, and with intermittent shortness of breath.  Has not had any syncopal episodes but has had significant near syncope on occasion.  Overall better.  Started notice more fatigue. negative for - chest pain, dyspnea on exertion, edema, orthopnea, paroxysmal nocturnal dyspnea, shortness of breath, or true syncope, TIA or amaurosis fugax, claudication  ROS:  Review of Systems - Negative except Symptoms noted above; her BM issues have become notably improved.  Appetite still slow.  Hydration better.    Objective    Studies Reviewed: .        3-day Zio patch monitor (May 2025): Sinus rhythm/sinus bradycardia with rate range of 55 to 166 bpm and average of 91 bpm.  1 6 beat run of nonsustained atrial tachycardia.  No VT or A-fib.  Otherwise rare PACs and PVCs.  Risk Assessment/Calculations:               Physical Exam:   VS:  BP 116/70 (BP Location: Right Arm, Patient Position: Sitting)   Pulse 70   Ht 4'  10 (1.473 m)   Wt 103 lb 6.4 oz (46.9 kg)   SpO2 98%   BMI 21.61 kg/m    Wt Readings from Last 3 Encounters:  09/23/23 103 lb 6.4 oz (46.9 kg)  04/29/23 98 lb 6.4 oz (44.6 kg)  11/15/22 102 lb 1.2 oz (46.3 kg)      GEN: Well nourished, well developed in no acute distress; healthy-appearing. NECK: No JVD; No carotid bruits CARDIAC: Normal S1, S2; RRR, no murmurs, rubs, gallops RESPIRATORY:  Clear to auscultation without rales, wheezing or rhonchi ; nonlabored, good air  movement. ABDOMEN: Soft, non-tender, non-distended EXTREMITIES:  No edema; No deformity      ASSESSMENT AND PLAN: .    Problem List Items Addressed This Visit       Cardiology Problems   Paroxysmal tachycardia (HCC)   Intermittent tachycardia episodes, partially controlled with current metoprolol  regimen. No arrhythmias noted.  Monitor really only noted short PAT runs.  Likely also has sinus tachycardia - Switch to long-acting metoprolol , same total daily dose.  (Convert from Lopressor  to Toprol .   Will continue with Toprol  50 mg in the morning but will try to start off with Toprol  25 mg in the evening and titrate up if necessary.   She also has the short acting Lopressor  for breakthroughs. - Retain short-acting metoprolol  for breakthrough symptoms. - Advised on hydration and electrolyte solutions.      Relevant Medications   metoprolol  succinate (TOPROL  XL) 25 MG 24 hr tablet     Other   Dysautonomia (HCC) - Primary   Fatigue   Persistent fatigue, possibly linked to beta-blocker use. Switching to Toprol  from Lopressor . - We will start off by reducing reducing evening metoprolol  dose (take 25 mg Toprol -XL) if fatigue continues. - Monitor fatigue post-switch to long-acting metoprolol .       Vasovagal syncopes   No recent syncope, occasional lightheadedness persists, improved with hydration. - Continue hydration strategies. - Use shower chair to prevent falls during lightheadedness. - Continue stool softeners Dr. Adjustments to avoid constipation-has not had any BM related syncope.               Follow-Up: Return for 3-4 month follow-up, [employer].    Signed, Alm MICAEL Clay, MD, MS Alm Clay, M.D., M.S. Interventional Chartered certified accountant  Pager # [phone]

## 2023-09-23 NOTE — Patient Instructions (Signed)
 Medication Instructions:  Your physician has recommended you make the following change in your medication:   -Stop taking metoprolol  tartrate (lopressor ).  -Start taking metoprolol  succincate (Toprol -XL) 50mg  in the morning with breakfast and 50mg  in the evening with supper.  Ok to take 25mg  in the evening to help with fatigue.  *If you need a refill on your cardiac medications before your next appointment, please call your pharmacy*   Follow-Up: At [facility], you and your health needs are our priority.  As part of our continuing mission to provide you with exceptional heart care, our providers are all part of one team.  This team includes your primary Cardiologist (physician) and Advanced Practice Providers or APPs (Physician Assistants and Nurse Practitioners) who all work together to provide you with the care you need, when you need it.  Your next appointment:   3-4 month(s) virtual visit   Provider:   Dr. Anner

## 2023-09-23 NOTE — Assessment & Plan Note (Signed)
 Intermittent tachycardia episodes, partially controlled with current metoprolol  regimen. No arrhythmias noted.  Monitor really only noted short PAT runs.  Likely also has sinus tachycardia - Switch to long-acting metoprolol , same total daily dose.  (Convert from Lopressor  to Toprol .   Will continue with Toprol  50 mg in the morning but will try to start off with Toprol  25 mg in the evening and titrate up if necessary.   She also has the short acting Lopressor  for breakthroughs. - Retain short-acting metoprolol  for breakthrough symptoms. - Advised on hydration and electrolyte solutions.

## 2023-12-30 ENCOUNTER — Telehealth: Payer: Self-pay | Admitting: *Deleted

## 2023-12-30 ENCOUNTER — Ambulatory Visit: Admitting: Cardiology

## 2023-12-30 NOTE — Telephone Encounter (Signed)
 Called patient to begin virtual visit  Patient stated she called earlier and cancelled the appointment.  Appt cancelled

## 2024-01-19 IMAGING — CT CT CERVICAL SPINE W/O CM
3 of 4 series · 10 of 33 positions shown, 12 images · non-contrast
Comparison: MRI head and MRI cervical spine 09/25/2020.

CLINICAL DATA: Head trauma, moderate-severe; Neck trauma, midline
tenderness (Age 16-64y)



[Series 5: coronals · coronal · 0.30mm/px · 3 of 72 slices shown]
[im 20/72  bone]
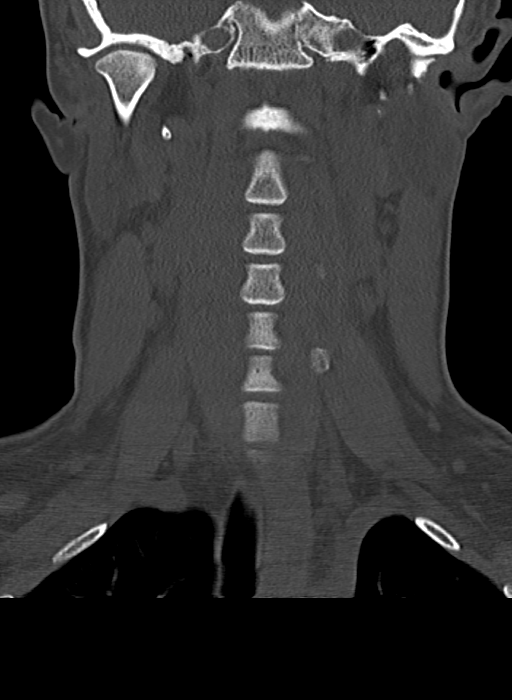
[im 31/72  bone]
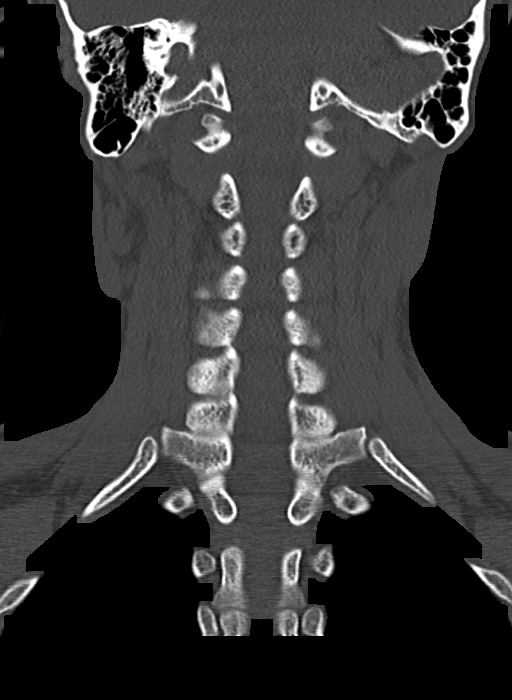
[im 41/72  bone]
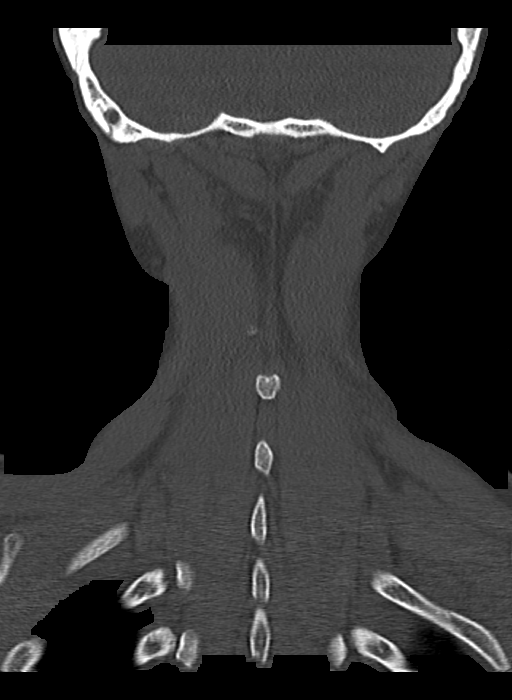

[Series 6: sagittals · sagittal · 0.21mm/px · 5 of 69 slices shown, 6 images]
[im 23/69  bone]
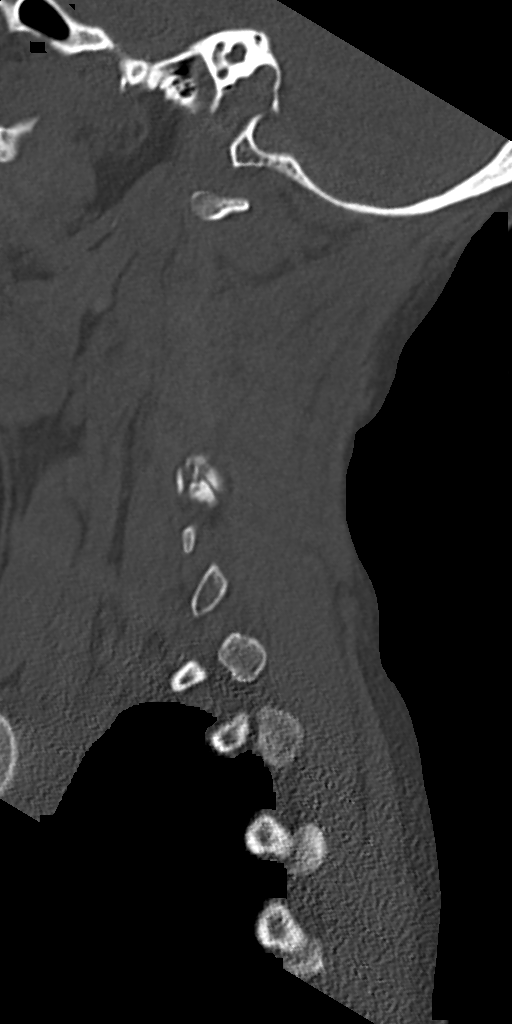
[im 29/69  bone]
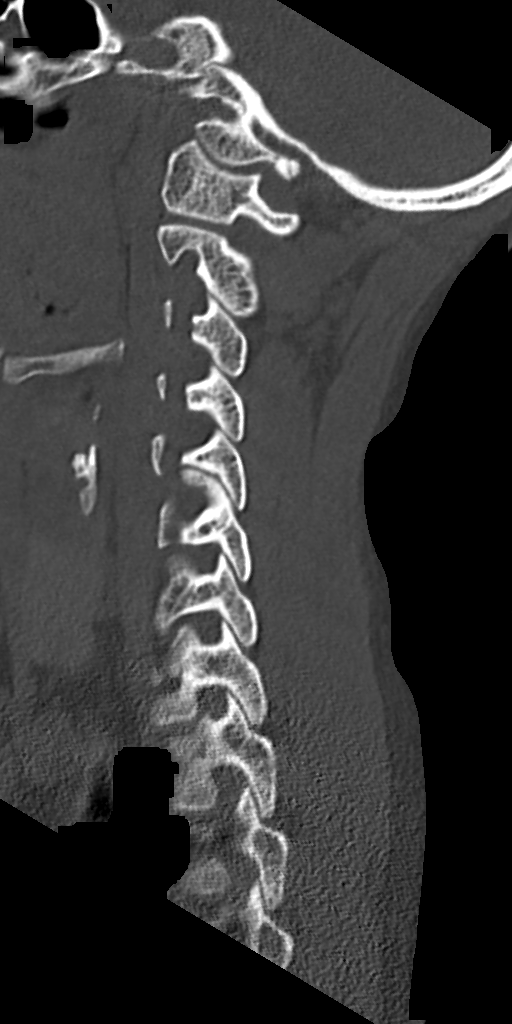
[im 35/69  soft-tissue]
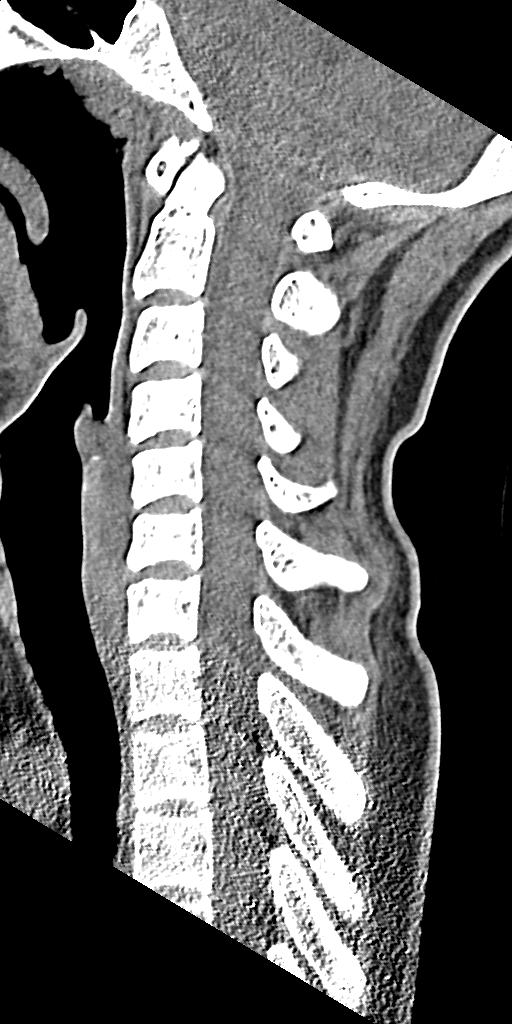
[im 35/69  bone]
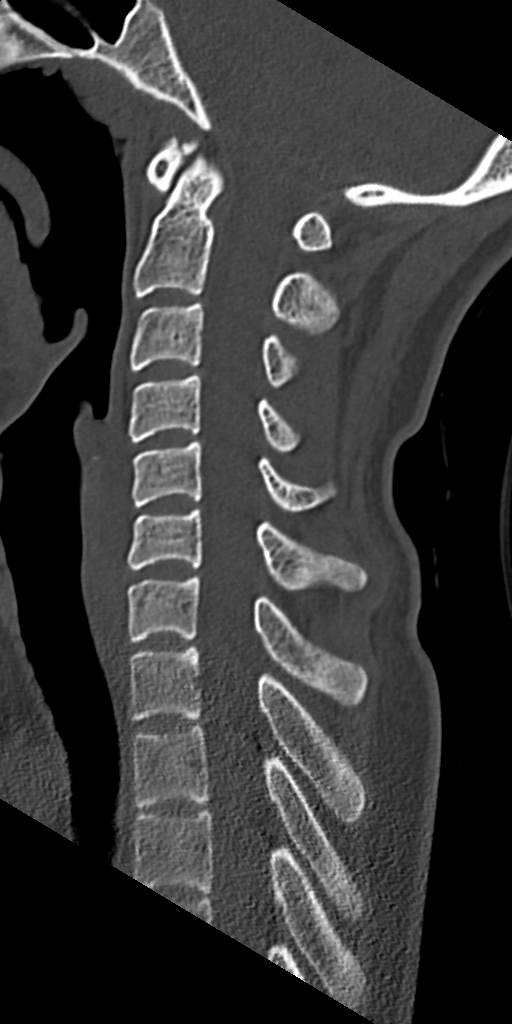
[im 40/69  bone]
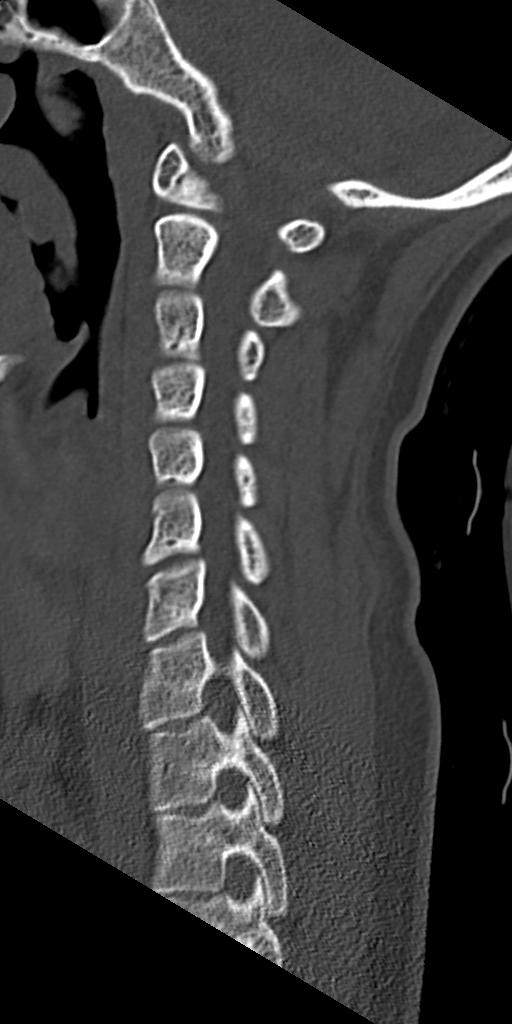
[im 46/69  bone]
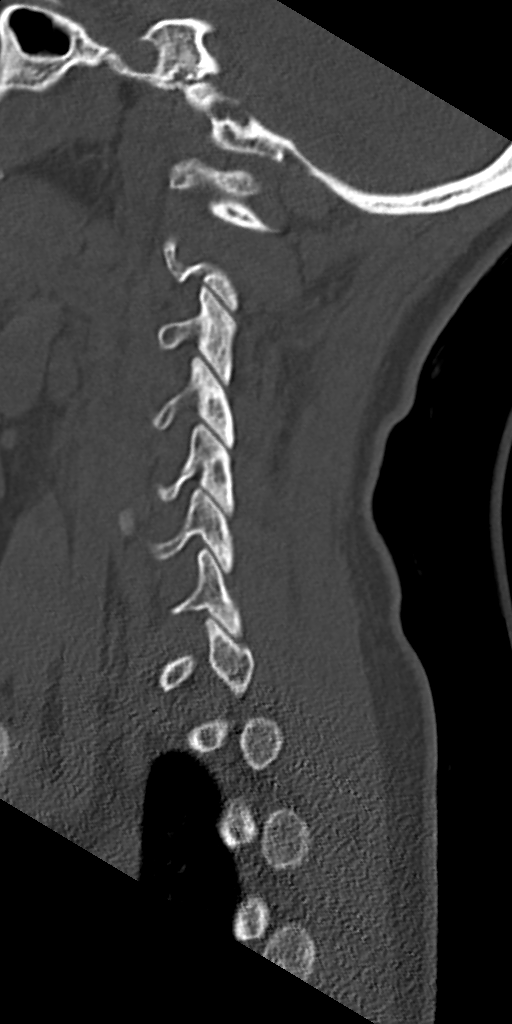

[Series 7: orthogonals · axial · 0.23mm/px · z∈[-264,-185]mm · 2 of 106 slices shown, 3 images]
[im 31/106  soft-tissue]
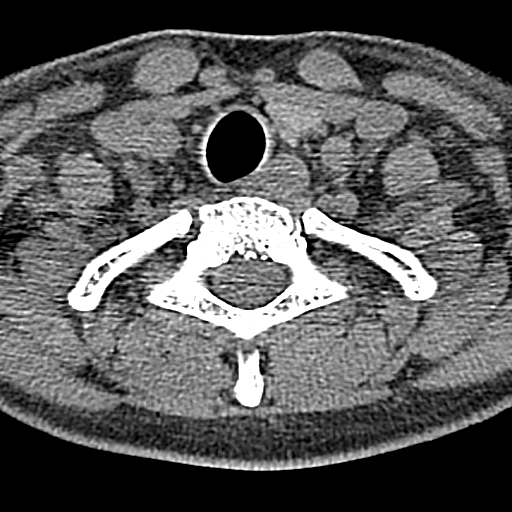
[im 31/106  bone]
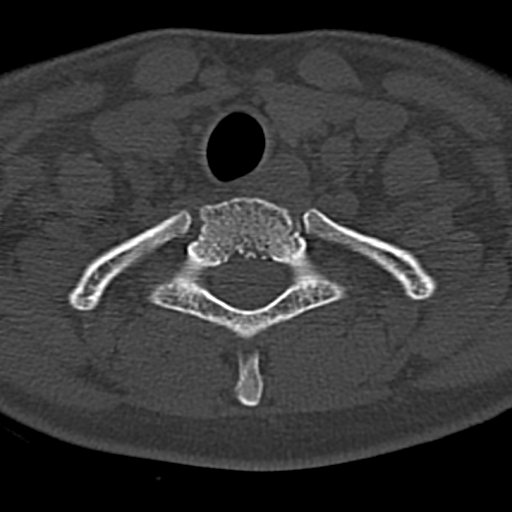
[im 76/106  bone]
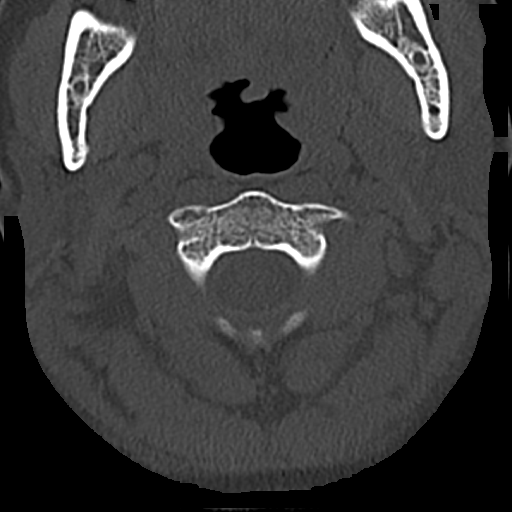

[10 of 33 positions shown; findings below may reference images not displayed]

FINDINGS: CT HEAD FINDINGS

Brain: No evidence of acute infarction, hemorrhage, hydrocephalus,
extra-axial collection or mass lesion/mass effect. Chronic mildly
prominent retro cerebellar CSF, likely mega cisterna magna.

Vascular: No hyperdense vessel identified.

Skull: No acute fracture.

Sinuses/Orbits: Visualized sinuses are clear.

Other: No mastoid effusions.

CT CERVICAL SPINE FINDINGS

Alignment: Straightening of the normal cervical lordosis. No
substantial sagittal subluxation.

Skull base and vertebrae: Vertebral body heights are maintained. No
acute fracture.

Soft tissues and spinal canal: No prevertebral fluid or swelling. No
visible canal hematoma.

Disc levels: Mild right eccentric facet arthropathy at C5-C6.
Otherwise, no significant bony degenerative change.

Upper chest: Visualized lung apices are clear.
IMPRESSION: 1. No evidence of acute intracranial abnormality.
2. No evidence of acute fracture or traumatic malalignment in the
cervical spine.

## 2024-01-19 IMAGING — CT CT HEAD W/O CM
3 series · 15 of 47 positions shown, 18 images · non-contrast
Comparison: MRI head and MRI cervical spine 09/25/2020.

CLINICAL DATA: Head trauma, moderate-severe; Neck trauma, midline
tenderness (Age 16-64y)



[Series 2: head 5.0 h30s · axial · 0.44mm/px · z∈[-128,-3]mm · 9 of 30 slices shown, 12 images]
[im 3/30  brain]
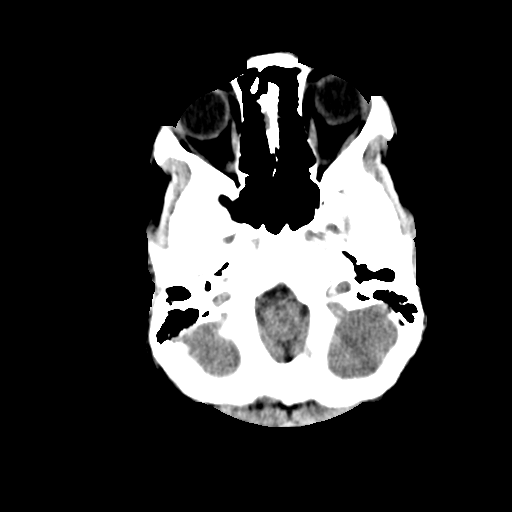
[im 3/30  bone]
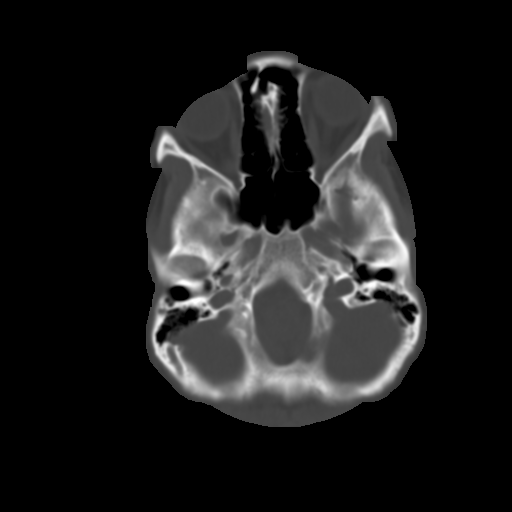
[im 6/30  brain]
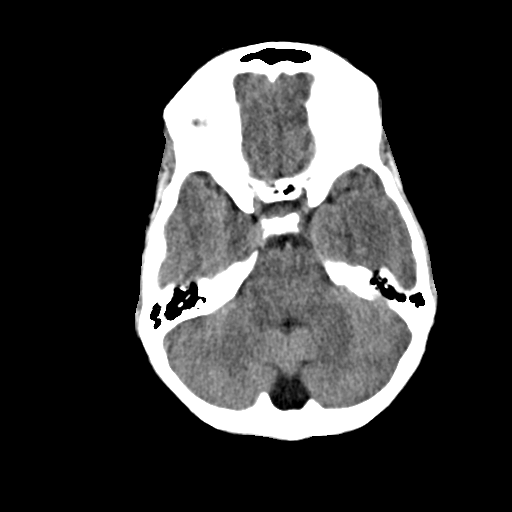
[im 9/30  brain]
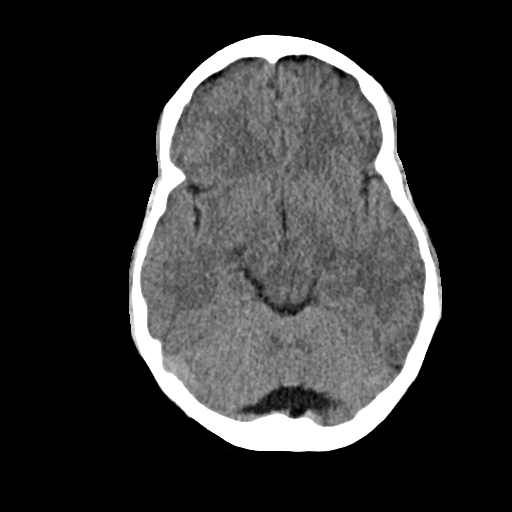
[im 12/30  brain]
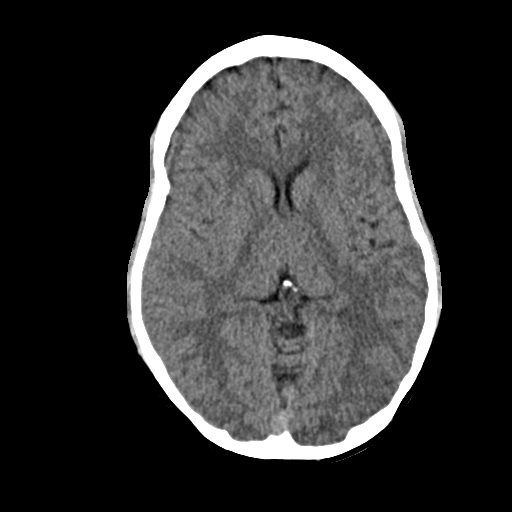
[im 16/30  brain]
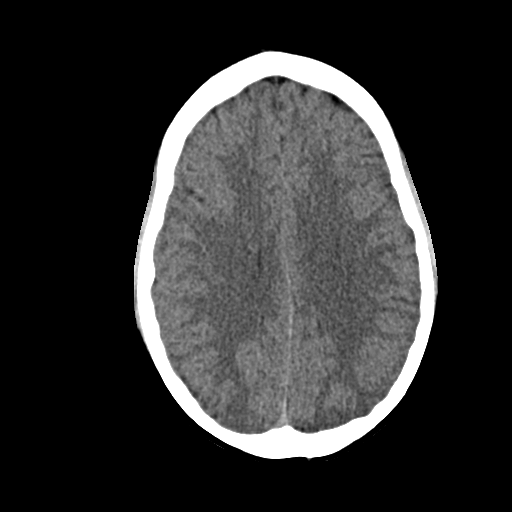
[im 16/30  bone]
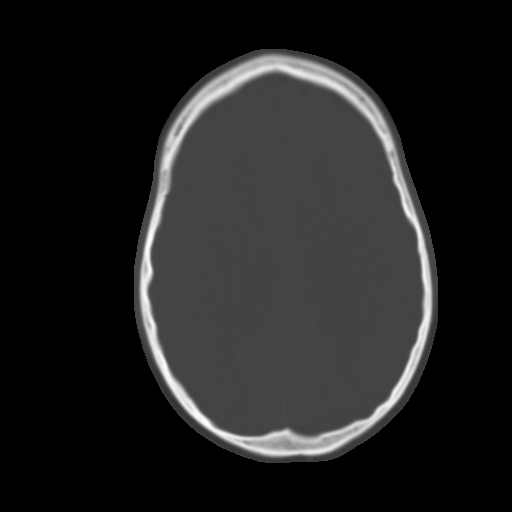
[im 19/30  brain]
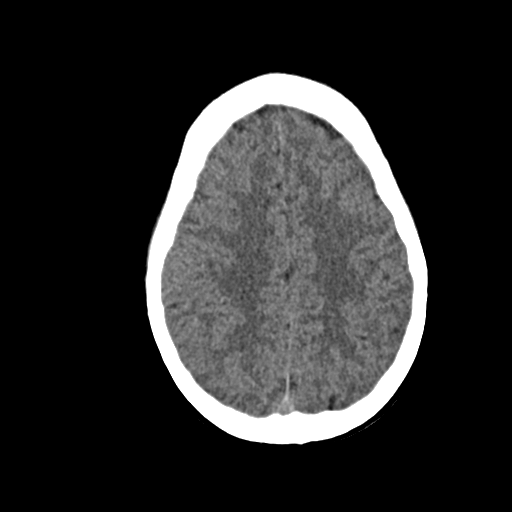
[im 22/30  brain]
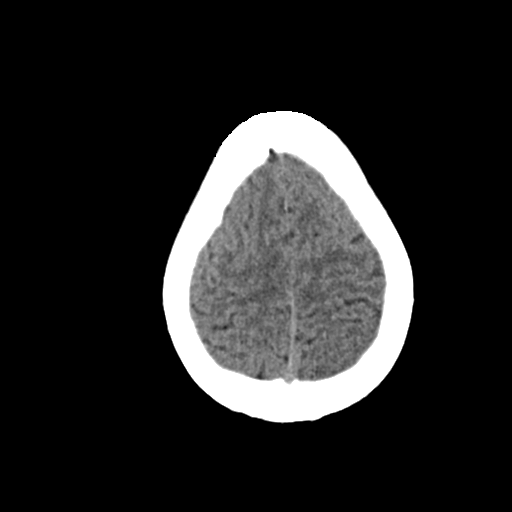
[im 25/30  brain]
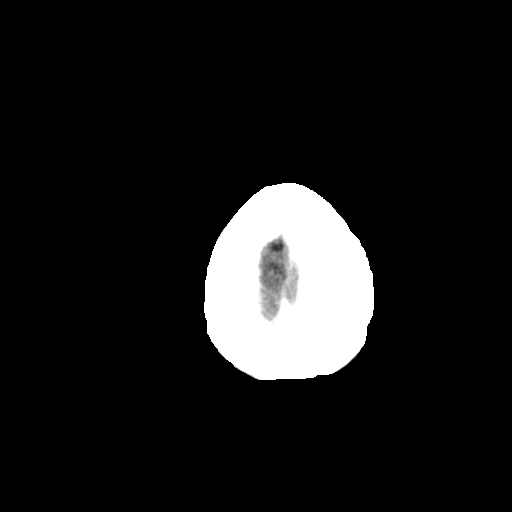
[im 28/30  brain]
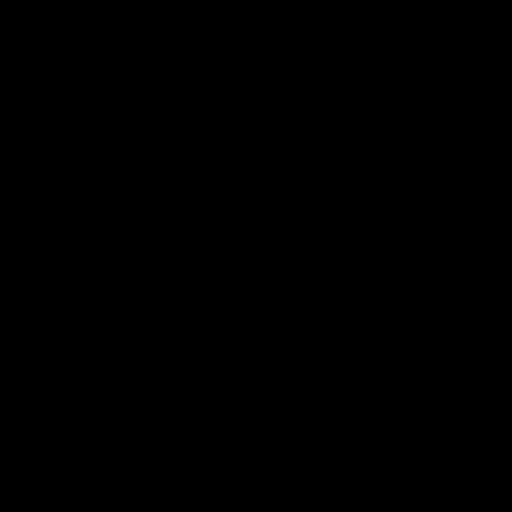
[im 28/30  bone]
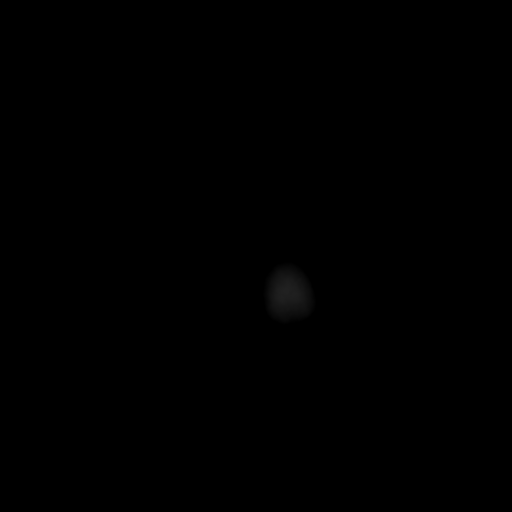

[Series 4: head 3.0 mpr cor · coronal · 0.30mm/px · 3 of 67 slices shown]
[im 23/67  brain]
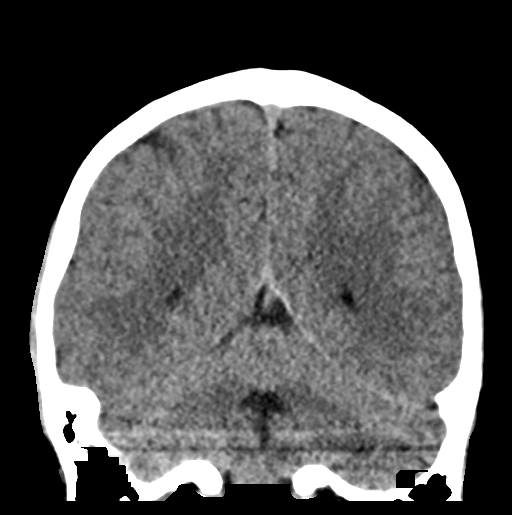
[im 30/67  brain]
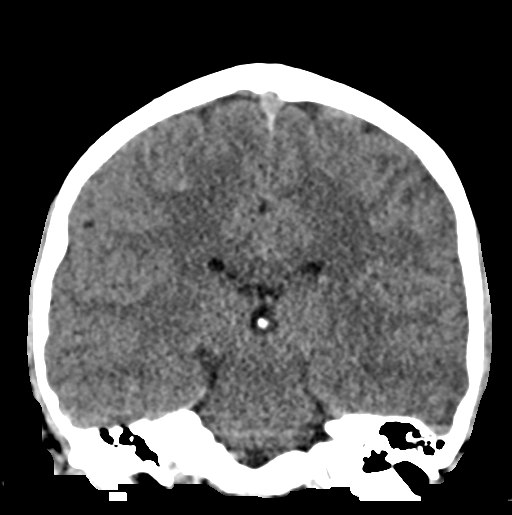
[im 37/67  brain]
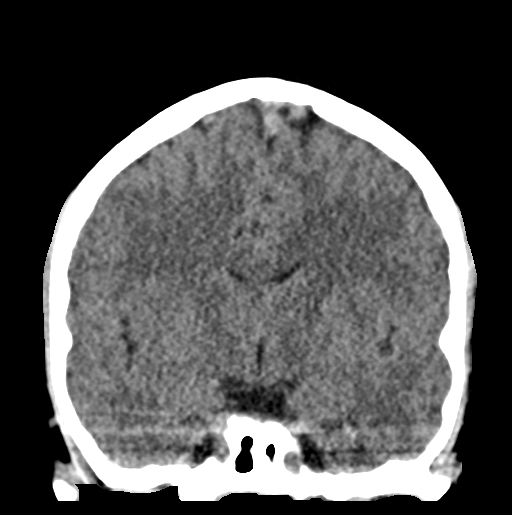

[Series 5: head 3.0 mpr sag · sagittal · 0.30mm/px · 3 of 52 slices shown]
[im 18/52  brain]
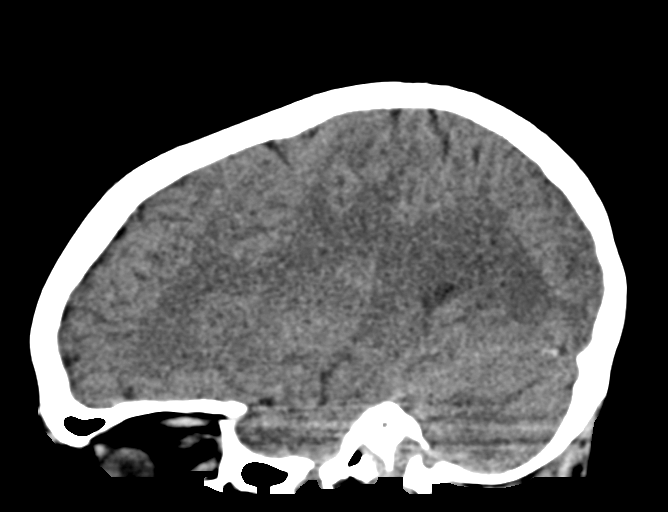
[im 26/52  brain]
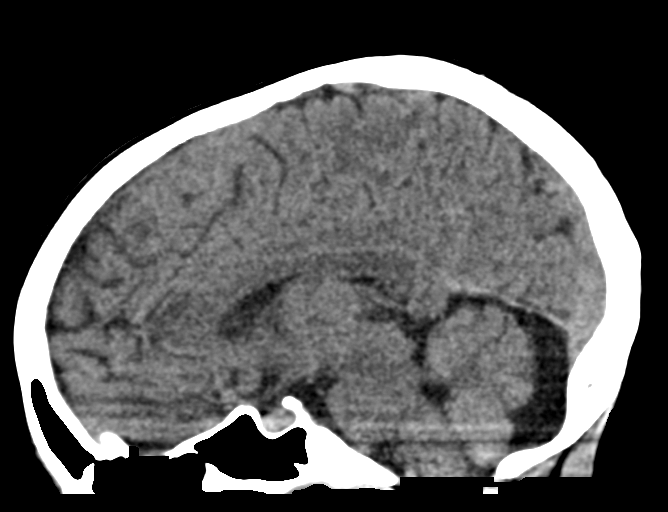
[im 35/52  brain]
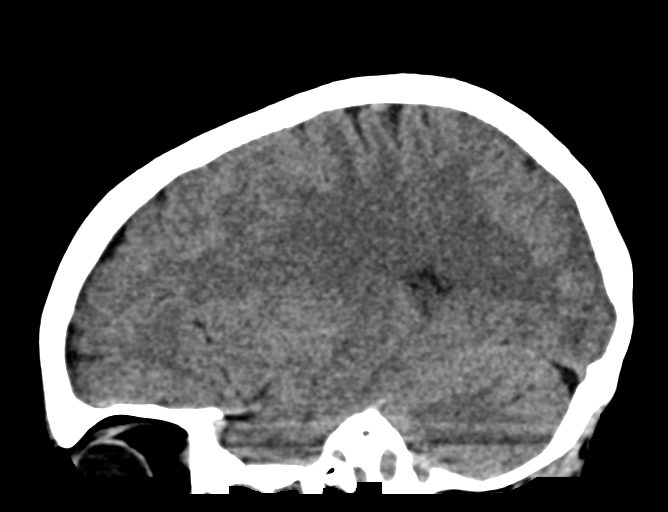

[15 of 47 positions shown; findings below may reference images not displayed]

FINDINGS: CT HEAD FINDINGS

Brain: No evidence of acute infarction, hemorrhage, hydrocephalus,
extra-axial collection or mass lesion/mass effect. Chronic mildly
prominent retro cerebellar CSF, likely mega cisterna magna.

Vascular: No hyperdense vessel identified.

Skull: No acute fracture.

Sinuses/Orbits: Visualized sinuses are clear.

Other: No mastoid effusions.

CT CERVICAL SPINE FINDINGS

Alignment: Straightening of the normal cervical lordosis. No
substantial sagittal subluxation.

Skull base and vertebrae: Vertebral body heights are maintained. No
acute fracture.

Soft tissues and spinal canal: No prevertebral fluid or swelling. No
visible canal hematoma.

Disc levels: Mild right eccentric facet arthropathy at C5-C6.
Otherwise, no significant bony degenerative change.

Upper chest: Visualized lung apices are clear.
IMPRESSION: 1. No evidence of acute intracranial abnormality.
2. No evidence of acute fracture or traumatic malalignment in the
cervical spine.
